# Patient Record
Sex: Female | Born: 1985 | Race: White | Hispanic: No | Marital: Married | Smoking: Never smoker
Health system: Southern US, Community
[De-identification: ages and names within clinical notes are randomized; demographics above are authoritative.]

## PROBLEM LIST (undated history)

## (undated) DIAGNOSIS — I10 Essential (primary) hypertension: Secondary | ICD-10-CM

## (undated) DIAGNOSIS — K219 Gastro-esophageal reflux disease without esophagitis: Secondary | ICD-10-CM

## (undated) DIAGNOSIS — F419 Anxiety disorder, unspecified: Secondary | ICD-10-CM

## (undated) DIAGNOSIS — T7840XA Allergy, unspecified, initial encounter: Secondary | ICD-10-CM

## (undated) DIAGNOSIS — F32A Depression, unspecified: Secondary | ICD-10-CM

## (undated) DIAGNOSIS — J45909 Unspecified asthma, uncomplicated: Secondary | ICD-10-CM

## (undated) DIAGNOSIS — E079 Disorder of thyroid, unspecified: Secondary | ICD-10-CM

## (undated) HISTORY — DX: Unspecified asthma, uncomplicated: J45.909

## (undated) HISTORY — DX: Gastro-esophageal reflux disease without esophagitis: K21.9

## (undated) HISTORY — DX: Anxiety disorder, unspecified: F41.9

## (undated) HISTORY — DX: Essential (primary) hypertension: I10

## (undated) HISTORY — PX: CHOLECYSTECTOMY: SHX55

## (undated) HISTORY — DX: Depression, unspecified: F32.A

## (undated) HISTORY — DX: Allergy, unspecified, initial encounter: T78.40XA

## (undated) HISTORY — PX: ESOPHAGUS SURGERY: SHX626

---

## 2008-05-27 ENCOUNTER — Ambulatory Visit: Payer: Self-pay | Admitting: Family Medicine

## 2008-05-27 DIAGNOSIS — G43909 Migraine, unspecified, not intractable, without status migrainosus: Secondary | ICD-10-CM | POA: Insufficient documentation

## 2008-06-01 ENCOUNTER — Encounter: Payer: Self-pay | Admitting: Family Medicine

## 2008-10-22 DIAGNOSIS — I10 Essential (primary) hypertension: Secondary | ICD-10-CM | POA: Insufficient documentation

## 2011-01-24 NOTE — Letter (Signed)
Summary: GYNECOLOGY NOTES  GYNECOLOGY NOTES   Imported By: Harlene Salts 07/16/2008 14:56:34  _____________________________________________________________________  External Attachment:    Type:   Image     Comment:   External Document

## 2014-09-01 DIAGNOSIS — K219 Gastro-esophageal reflux disease without esophagitis: Secondary | ICD-10-CM | POA: Insufficient documentation

## 2014-11-17 ENCOUNTER — Emergency Department
Admission: EM | Admit: 2014-11-17 | Discharge: 2014-11-17 | Disposition: A | Discharge status: Home / Self Care | Payer: PRIVATE HEALTH INSURANCE | Source: Home / Self Care

## 2014-11-17 ENCOUNTER — Encounter: Payer: Self-pay | Admitting: *Deleted

## 2014-11-17 DIAGNOSIS — J069 Acute upper respiratory infection, unspecified: Secondary | ICD-10-CM

## 2014-11-17 DIAGNOSIS — J029 Acute pharyngitis, unspecified: Secondary | ICD-10-CM

## 2014-11-17 DIAGNOSIS — B9789 Other viral agents as the cause of diseases classified elsewhere: Principal | ICD-10-CM

## 2014-11-17 LAB — POCT RAPID STREP A (OFFICE): Rapid Strep A Screen: NEGATIVE

## 2014-11-17 MED ORDER — AZITHROMYCIN 250 MG PO TABS: ORAL_TABLET | ORAL | Status: DC

## 2014-11-17 MED ORDER — PREDNISONE 20 MG PO TABS: 20.0000 mg | ORAL_TABLET | Freq: Two times a day (BID) | ORAL | Status: DC

## 2014-11-17 MED ORDER — BENZONATATE 200 MG PO CAPS: 200.0000 mg | ORAL_CAPSULE | Freq: Every day | ORAL | Status: DC

## 2014-11-17 NOTE — Discharge Instructions (Signed)
Take plain Mucinex (1200 mg guaifenesin) twice daily for cough and congestion.  May add Sudafed for sinus congestion.   Increase fluid intake, rest. May use Afrin nasal spray (or generic oxymetazoline) twice daily for about 5 days.  Also recommend using saline nasal spray several times daily and saline nasal irrigation (AYR is a common brand) Try warm salt water gargles for sore throat.  Stop all antihistamines for now, and other non-prescription cough/cold preparations. Continue albuterol inhaler as needed. Begin Azithromycin if not improving about one week or if persistent fever develops Follow-up with family doctor if not improving 7 to 10 days.

## 2014-11-17 NOTE — ED Provider Notes (Signed)
CSN: [id number]     Arrival date & time 11/17/14  1359 History   None    Chief Complaint  Patient presents with  . Sore Throat  . Cough  . Otalgia     HPI Comments: Patient developed a sore throat and fatigue yesterday.  Last night she developed a partly productive cough.  Today she developed myalgias, sinus congestion, chills/sweats, and left ear feels full. She has had occasional wheezing. She does not have asthma but has had bronchospasm in the past with respiratory infections and has been prescribed an albuterol inhaler.  She does have a family history of asthma (maternal uncle and grandfather).  The history is provided by the patient.    History reviewed. No pertinent past medical history. Past Surgical History  Procedure Laterality Date  . Esophagus surgery     Family History  Problem Relation Age of Onset  . Hypertension Mother   . Hypertension Father    History  Substance Use Topics  . Smoking status: Never Smoker   . Smokeless tobacco: Not on file  . Alcohol Use: No   OB History    No data available     Review of Systems + sore throat + cough No pleuritic pain + wheezing + nasal congestion + post-nasal drainage No sinus pain/pressure No itchy/red eyes ? earache No hemoptysis No SOB No fever, + chills/sweats + nausea No vomiting No abdominal pain No diarrhea No urinary symptoms No skin rash + fatigue + myalgias + headache Used OTC meds without relief   Allergies  Latex  Home Medications   Prior to Admission medications   Medication Sig Start Date End Date Taking? Authorizing Provider  cetirizine (ZYRTEC) 10 MG tablet Take 10 mg by mouth daily.   Yes Historical Provider, MD  dicyclomine (BENTYL) 10 MG capsule Take 10 mg by mouth 4 (four) times daily -  before meals and at bedtime.   Yes Historical Provider, MD  MULTIPLE VITAMIN PO Take by mouth.   Yes Historical Provider, MD  omeprazole (PRILOSEC) 40 MG capsule Take 40 mg by mouth daily.    Yes Historical Provider, MD  azithromycin (ZITHROMAX Z-PAK) 250 MG tablet Take 2 tabs today; then begin one tab once daily for 4 more days. (Rx void after 11/25/14) 11/17/14   Lattie HawStephen A Beese, MD  benzonatate (TESSALON) 200 MG capsule Take 1 capsule (200 mg total) by mouth at bedtime. 11/17/14   Lattie HawStephen A Beese, MD  predniSONE (DELTASONE) 20 MG tablet Take 1 tablet (20 mg total) by mouth 2 (two) times daily. Take with food. 11/17/14   Lattie HawStephen A Beese, MD   BP 131/83 mmHg  Pulse 102  Temp(Src) 98.4 F (36.9 C) (Oral)  Resp 18  Ht 5\' 5"  (1.651 m)  Wt 220 lb (99.791 kg)  BMI 36.61 kg/m2  SpO2 99%  LMP 11/17/2014 Physical Exam Nursing notes and Vital Signs reviewed. Appearance:  Patient appears stated age, and in no acute distress.  Patient is obese (BMI 36.6) Eyes:  Pupils are equal, round, and reactive to light and accomodation.  Extraocular movement is intact.  Conjunctivae are not inflamed  Ears:  Canals normal.  Tympanic membranes normal.  Nose:  Mildly congested turbinates.  No sinus tenderness.  Pharynx:  Normal Neck:  Supple.   Tender enlarged posterior nodes are palpated bilaterally  Lungs:  Clear to auscultation.  Breath sounds are equal.  Chest:  Distinct tenderness to palpation over the mid-sternum.  Heart:  Regular rate and rhythm  without murmurs, rubs, or gallops.  Abdomen:  Nontender without masses or hepatosplenomegaly.  Bowel sounds are present.  No CVA or flank tenderness.  Extremities:  No edema.  No calf tenderness Skin:  No rash present.   ED Course  Procedures  None    Labs Reviewed  POCT RAPID STREP A (OFFICE) negative         MDM   1. Viral URI with cough; suspect intermittent bronchospasm    There is no evidence of bacterial infection today.  Treat symptomatically for now  Begin prednisone burst.  Prescription written for Benzonatate (Tessalon) to take at bedtime for night-time cough.  Take plain Mucinex (1200 mg guaifenesin) twice daily for cough and  congestion.  May add Sudafed for sinus congestion.   Increase fluid intake, rest. May use Afrin nasal spray (or generic oxymetazoline) twice daily for about 5 days.  Also recommend using saline nasal spray several times daily and saline nasal irrigation (AYR is a common brand) Try warm salt water gargles for sore throat.  Stop all antihistamines for now, and other non-prescription cough/cold preparations. Continue albuterol inhaler as needed. Begin Azithromycin if not improving about one week or if persistent fever develops (Given a prescription to hold, with an expiration date)  Follow-up with family doctor if not improving 7 to 10 days.     Lattie HawStephen A Beese, MD 11/17/14 [phone]

## 2014-11-17 NOTE — ED Notes (Signed)
Pt c/o sore throat, cough, sneezing, LT ear ache and congestion x 1 day. Denies fever.

## 2016-06-15 DIAGNOSIS — E038 Other specified hypothyroidism: Secondary | ICD-10-CM | POA: Insufficient documentation

## 2018-04-04 DIAGNOSIS — E063 Autoimmune thyroiditis: Secondary | ICD-10-CM | POA: Insufficient documentation

## 2019-02-04 DIAGNOSIS — M519 Unspecified thoracic, thoracolumbar and lumbosacral intervertebral disc disorder: Secondary | ICD-10-CM | POA: Insufficient documentation

## 2021-01-06 DIAGNOSIS — J309 Allergic rhinitis, unspecified: Secondary | ICD-10-CM | POA: Insufficient documentation

## 2021-03-18 DIAGNOSIS — F4310 Post-traumatic stress disorder, unspecified: Secondary | ICD-10-CM | POA: Insufficient documentation

## 2022-01-31 ENCOUNTER — Telehealth: Payer: PRIVATE HEALTH INSURANCE | Admitting: Physician Assistant

## 2022-01-31 DIAGNOSIS — K529 Noninfective gastroenteritis and colitis, unspecified: Secondary | ICD-10-CM | POA: Diagnosis not present

## 2022-01-31 NOTE — Progress Notes (Signed)
Virtual Visit Consent   Heather Krause, you are scheduled for a virtual visit with a [facility] provider today.     Just as with appointments in the office, your consent must be obtained to participate.  Your consent will be active for this visit and any virtual visit you may have with one of our providers in the next 365 days.     If you have a MyChart account, a copy of this consent can be sent to you electronically.  All virtual visits are billed to your insurance company just like a traditional visit in the office.    As this is a virtual visit, video technology does not allow for your provider to perform a traditional examination.  This may limit your provider's ability to fully assess your condition.  If your provider identifies any concerns that need to be evaluated in person or the need to arrange testing (such as labs, EKG, etc.), we will make arrangements to do so.     Although advances in technology are sophisticated, we cannot ensure that it will always work on either your end or our end.  If the connection with a video visit is poor, the visit may have to be switched to a telephone visit.  With either a video or telephone visit, we are not always able to ensure that we have a secure connection.     I need to obtain your verbal consent now.   Are you willing to proceed with your visit today?    Heather Krause has provided verbal consent on 01/31/2022 for a virtual visit (video or telephone).   Piedad Climes, [place]   Date: 01/31/2022 8:33 AM   Virtual Visit via Video Note   I, Piedad Climes, connected with  Heather Krause  ([id number], 1986-10-07) on 01/31/22 at  8:15 AM EST by a video-enabled telemedicine application and verified that I am speaking with the correct person using two identifiers.  Location: Patient: Virtual Visit Location Patient: Home Provider: Virtual Visit Location Provider: Home Office   I discussed the limitations of evaluation and management  by telemedicine and the availability of in person appointments. The patient expressed understanding and agreed to proceed.    History of Present Illness: Heather Krause is a 36 y.o. who identifies as a female who was assigned female at birth, and is being seen today for nausea and vomiting last night. Notes eating a sub for lunch and right after having some mild stomach upset. Went to the bathroom and then felt fine. Around 9 o'clock last night noted abdominal cramping, nausea and vomiting about every hour since onset. Denies hematemesis. Is also noting diarrhea that was very frequently but has slowed down at this point. Denies fever. Last episode of emesis was about 7 AM.   HPI: HPI  Problems:  Patient Active Problem List   Diagnosis Date Noted   MIGRAINE HEADACHE 05/27/2008    Allergies:  Allergies  Allergen Reactions   Bisoprolol-Hydrochlorothiazide Swelling   Diclofenac Sodium Other (See Comments)   Latex    Medications:  Current Outpatient Medications:    cetirizine (ZYRTEC) 10 MG tablet, Take 10 mg by mouth daily., Disp: , Rfl:    dicyclomine (BENTYL) 10 MG capsule, Take 10 mg by mouth 4 (four) times daily -  before meals and at bedtime., Disp: , Rfl:    MULTIPLE VITAMIN PO, Take by mouth., Disp: , Rfl:    omeprazole (PRILOSEC) 40 MG capsule, Take 40  mg by mouth daily., Disp: , Rfl:   Observations/Objective: Patient is well-developed, well-nourished in no acute distress.  Resting comfortably at home.  Head is normocephalic, atraumatic.  No labored breathing. Speech is clear and coherent with logical content.  Patient is alert and oriented at baseline.   Assessment and Plan: 1. Gastroenteritis  Viral versus likely mild food poisoning. No melena, hematochezia or hematemesis. Stools have slowed down. Vomiting improved after she used an old phenergan suppository she had from history of migraines. No indication for antibiotic at present. Will have her push fluids for hydration.  Will give Rx for Zofran ODT to help with nausea and prevent further vomiting so she can successfully hydrate. Will work towards bland foods when ready. BRAT diet handout given. Will have her increase her PPI to BID dosing for the next 2-3 days giving gastritis from retching. Very strict ER precautions reviewed with patient.   Follow Up Instructions: I discussed the assessment and treatment plan with the patient. The patient was provided an opportunity to ask questions and all were answered. The patient agreed with the plan and demonstrated an understanding of the instructions.  A copy of instructions were sent to the patient via MyChart unless otherwise noted below.   The patient was advised to call back or seek an in-person evaluation if the symptoms worsen or if the condition fails to improve as anticipated.  Time:  I spent 12 minutes with the patient via telehealth technology discussing the above problems/concerns.    Piedad Climes, PA-C

## 2022-01-31 NOTE — Patient Instructions (Signed)
Heather Krause, thank you for joining Piedad Climes, PA-C for today's virtual visit.  While this provider is not your primary care provider (PCP), if your PCP is located in our provider database this encounter information will be shared with them immediately following your visit.  Consent: [name] provided verbal consent for this virtual visit at the beginning of the encounter.  Current Medications:  Current Outpatient Medications:    azithromycin (ZITHROMAX Z-PAK) 250 MG tablet, Take 2 tabs today; then begin one tab once daily for 4 more days. (Rx void after 11/25/14), Disp: 6 tablet, Rfl: 0   benzonatate (TESSALON) 200 MG capsule, Take 1 capsule (200 mg total) by mouth at bedtime., Disp: 12 capsule, Rfl: 0   cetirizine (ZYRTEC) 10 MG tablet, Take 10 mg by mouth daily., Disp: , Rfl:    dicyclomine (BENTYL) 10 MG capsule, Take 10 mg by mouth 4 (four) times daily -  before meals and at bedtime., Disp: , Rfl:    MULTIPLE VITAMIN PO, Take by mouth., Disp: , Rfl:    omeprazole (PRILOSEC) 40 MG capsule, Take 40 mg by mouth daily., Disp: , Rfl:    predniSONE (DELTASONE) 20 MG tablet, Take 1 tablet (20 mg total) by mouth 2 (two) times daily. Take with food., Disp: 10 tablet, Rfl: 0   Medications ordered in this encounter:  No orders of the defined types were placed in this encounter.    *If you need refills on other medications prior to your next appointment, please contact your pharmacy*  Follow-Up: Call back or seek an in-person evaluation if the symptoms worsen or if the condition fails to improve as anticipated.  Other Instructions Please take the zofran as directed to keep nausea down and prevent vomiting.  Continue to try and hydrate, starting with water and then when tolerating well, adding on a low sugar electrolyte water like pedialyte. Once you feel up to it, you can start solid foods following diet below.   Increase your omeprazole to twice daily dosing  for the next couple of days.   If you note any recurrence of significant vomiting despite medication, and/or severe diarrhea -- you need evaluation at the ER. DO NOT DELAY CARE!  Bland Diet A bland diet consists of foods that are often soft and do not have a lot of fat, fiber, or extra seasonings. Foods without fat, fiber, or seasoning are easier for the body to digest. They are also less likely to irritate your mouth, throat, stomach, and other parts of your digestive system. A bland diet is sometimes called a BRAT diet. What is my plan? Your health care provider or food and nutrition specialist (dietitian) may recommend specific changes to your diet to prevent symptoms or to treat your symptoms. These changes may include: Eating small meals often. Cooking food until it is soft enough to chew easily. Chewing your food well. Drinking fluids slowly. Not eating foods that are very spicy, sour, or fatty. Not eating citrus fruits, such as oranges and grapefruit. What do I need to know about this diet? Eat a variety of foods from the bland diet food list. Do not follow a bland diet longer than needed. Ask your health care provider whether you should take vitamins or supplements. What foods can I eat? Grains Hot cereals, such as cream of wheat. Rice. Bread, crackers, or tortillas made from refined white flour. Vegetables Canned or cooked vegetables. Mashed or boiled potatoes. Fruits Bananas. Applesauce. Other types of cooked  or canned fruit with the skin and seeds removed, such as canned peaches or pears. Meats and other proteins Scrambled eggs. Creamy peanut butter or other nut butters. Lean, well-cooked meats, such as chicken or fish. Tofu. Soups or broths. Dairy Low-fat dairy products, such as milk, cottage cheese, or yogurt. Beverages Water. Herbal tea. Apple juice. Fats and oils Mild salad dressings. Canola or olive oil. Sweets and desserts Pudding. Custard. Fruit gelatin. Ice  cream. The items listed above may not be a complete list of recommended foods and beverages. Contact a dietitian for more options. What foods are not recommended? Grains Whole grain breads and cereals. Vegetables Raw vegetables. Fruits Raw fruits, especially citrus, berries, or dried fruits. Dairy Whole fat dairy foods. Beverages Caffeinated drinks. Alcohol. Seasonings and condiments Strongly flavored seasonings or condiments. Hot sauce. Salsa. Other foods Spicy foods. Fried foods. Sour foods, such as pickled or fermented foods. Foods with high sugar content. Foods high in fiber. The items listed above may not be a complete list of foods and beverages to avoid. Contact a dietitian for more information. Summary A bland diet consists of foods that are often soft and do not have a lot of fat, fiber, or extra seasonings. Foods without fat, fiber, or seasoning are easier for the body to digest. Check with your health care provider to see how long you should follow this diet plan. It is not meant to be followed for long periods. This information is not intended to replace advice given to you by your health care provider. Make sure you discuss any questions you have with your health care provider. Document Revised: 01/09/2018 Document Reviewed: 01/09/2018 Elsevier Patient Education  2022 [employer].    If you have been instructed to have an in-person evaluation today at a local Urgent Care facility, please use the link below. It will take you to a list of all of our available [facility], including address, phone number and hours of operation. Please do not delay care.  [facility]  If you or a family member do not have a primary care provider, use the link below to schedule a visit and establish care. When you choose a [place] primary care physician or advanced practice provider, you gain a long-term partner in health. Find a Primary Care Provider  Learn  more about [facility]'s in-office and virtual care options: [facility] - Get Care Now

## 2022-02-01 ENCOUNTER — Encounter: Payer: Self-pay | Admitting: Family Medicine

## 2022-02-09 NOTE — Telephone Encounter (Signed)
She was seen by Malva Cogan, PA-C.  She is not my patient.

## 2022-03-22 ENCOUNTER — Emergency Department (INDEPENDENT_AMBULATORY_CARE_PROVIDER_SITE_OTHER): Payer: No Typology Code available for payment source

## 2022-03-22 ENCOUNTER — Emergency Department
Admission: RE | Admit: 2022-03-22 | Discharge: 2022-03-22 | Disposition: A | Discharge status: Home / Self Care | Payer: No Typology Code available for payment source | Source: Ambulatory Visit

## 2022-03-22 VITALS — BP 164/105 | HR 76 | Temp 98.3°F | Resp 18

## 2022-03-22 DIAGNOSIS — S6992XA Unspecified injury of left wrist, hand and finger(s), initial encounter: Secondary | ICD-10-CM | POA: Diagnosis not present

## 2022-03-22 DIAGNOSIS — M79642 Pain in left hand: Secondary | ICD-10-CM

## 2022-03-22 DIAGNOSIS — R030 Elevated blood-pressure reading, without diagnosis of hypertension: Secondary | ICD-10-CM

## 2022-03-22 HISTORY — DX: Disorder of thyroid, unspecified: E07.9

## 2022-03-22 MED ORDER — PREDNISONE 20 MG PO TABS: ORAL_TABLET | ORAL | 0 refills | Status: DC

## 2022-03-22 NOTE — Discharge Instructions (Addendum)
Advised patient of left hand/left wrist x-ray results with hard copy provided with AVS this evening.  Advised patient to take medication as directed with food to completion.  Instructed patient to start this medication tomorrow morning, Thursday, 03/23/2022.  Advised patient if symptoms worsen and/or unresolved please follow-up with [facility] orthopedic provider (contact information has been provided above with this AVS).  Encouraged patient to increase daily water intake while taking this medication.  Advised patient to monitor blood pressure in the morning prior to eating for the next 7 to 10 days and to log measurements so that PCP can evaluate current daily blood pressure trends. ?

## 2022-03-22 NOTE — ED Provider Notes (Signed)
?[facility] ? ? ? ?CSN: [id number] ?Arrival date & time: 03/22/22  1753 ? ? ?  ? ?History   ?Chief Complaint ?Chief Complaint  ?Patient presents with  ? Hand Problem  ?  LT  ? ? ?HPI ?Heather Krause is a 36 y.o. female.  ? ?HPI 36 year old female presents with right hand pain since Friday evening, 03/17/2022.  Reports she was pulled by her dog's slamming her hand into brick wall.  Patient rates current right hand pain is 7 of 10. ? ?Past Medical History:  ?Diagnosis Date  ? Thyroid disease   ? ? ?Patient Active Problem List  ? Diagnosis Date Noted  ? MIGRAINE HEADACHE 05/27/2008  ? ? ?Past Surgical History:  ?Procedure Laterality Date  ? CHOLECYSTECTOMY    ? ESOPHAGUS SURGERY    ? ? ?OB History   ?No obstetric history on file. ?  ? ? ? ?Home Medications   ? ?Prior to Admission medications   ?Medication Sig Start Date End Date Taking? Authorizing Provider  ?gabapentin (NEURONTIN) 100 MG capsule Take by mouth. 07/01/21  Yes [provider]  ?Levothyroxine Sodium (TIROSINT) 200 MCG CAPS Take by mouth. 12/01/21  Yes [provider]  ?norethindrone (AYGESTIN) 5 MG tablet Take 1 tablet by mouth daily. 08/15/21  Yes [provider]  ?pantoprazole (PROTONIX) 40 MG tablet Take 1 tablet by mouth daily. 11/19/19 03/15/23 Yes [provider]  ?predniSONE (DELTASONE) 20 MG tablet Take 3 tabs PO daily x 5 days. 03/22/22  Yes Trevor Iha, FNP  ?sertraline (ZOLOFT) 50 MG tablet Take 1 tablet by mouth daily. 09/12/21  Yes [provider]  ?topiramate (TOPAMAX) 50 MG tablet Take 1 tablet by mouth daily. 03/16/22  Yes [provider]  ?AIMOVIG 140 MG/ML SOAJ Inject 1 mL into the skin every 30 (thirty) days. 03/21/22   [provider]  ?ARMOUR THYROID 30 MG tablet Take 30 mg by mouth daily. 03/21/22   [provider]  ?cetirizine (ZYRTEC) 10 MG tablet Take 10 mg by mouth daily.    [provider]  ?dicyclomine (BENTYL) 10 MG capsule Take 10 mg by  mouth 4 (four) times daily -  before meals and at bedtime.    [provider]  ?MULTIPLE VITAMIN PO Take by mouth.    [provider]  ?omeprazole (PRILOSEC) 40 MG capsule Take 40 mg by mouth daily.    [provider]  ?UBRELVY 100 MG TABS Take by mouth. 03/16/22   [provider]  ? ? ?Family History ?Family History  ?Problem Relation Age of Onset  ? Hypertension Mother   ? Aneurysm Mother   ? Hypertension Father   ? Cancer Maternal Grandmother   ? Diabetes Maternal Grandmother   ? ? ?Social History ?Social History  ? ?Tobacco Use  ? Smoking status: Never  ?Vaping Use  ? Vaping Use: Never used  ?Substance Use Topics  ? Alcohol use: No  ? Drug use: No  ? ? ? ?Allergies   ?Bisoprolol-hydrochlorothiazide, Diclofenac sodium, and Latex ? ? ?Review of Systems ?Review of Systems ? ? ?Physical Exam ?Triage Vital Signs ?ED Triage Vitals  ?Enc Vitals Group  ?   BP   ?   Pulse   ?   Resp   ?   Temp   ?   Temp src   ?   SpO2   ?   Weight   ?   Height   ?   Head Circumference   ?  Peak Flow   ?   Pain Score   ?   Pain Loc   ?   Pain Edu?   ?   Excl. in GC?   ? ?No data found. ? ?Updated Vital Signs ?BP (!) 164/105 (BP Location: Right Arm)   Pulse 76   Temp 98.3 ?F (36.8 ?C) (Oral)   Resp 18   LMP  (LMP Unknown)   SpO2 100%  ? ?Physical Exam ?Vitals and nursing note reviewed.  ?Constitutional:   ?   General: She is not in acute distress. ?   Appearance: Normal appearance. She is obese. She is not ill-appearing.  ?HENT:  ?   Head: Normocephalic and atraumatic.  ?   Mouth/Throat:  ?   Mouth: Mucous membranes are moist.  ?   Pharynx: Oropharynx is clear.  ?Eyes:  ?   Extraocular Movements: Extraocular movements intact.  ?   Conjunctiva/sclera: Conjunctivae normal.  ?   Pupils: Pupils are equal, round, and reactive to light.  ?Cardiovascular:  ?   Rate and Rhythm: Normal rate and regular rhythm.  ?   Pulses: Normal pulses.  ?   Heart sounds: Normal heart sounds.  ?Pulmonary:  ?   Effort:  Pulmonary effort is normal.  ?   Breath sounds: Normal breath sounds. No wheezing, rhonchi or rales.  ?Musculoskeletal:  ?   Cervical back: Normal range of motion and neck supple. No tenderness.  ?   Comments: Left hand (dorsum): TTP over proximal 3rd, 4th, 5th metacarpals, capitate, and hamate; with soft tissue swelling noted, grip is 2/5, unable to make fist, neurovascular intact, neurosensory intact.  ?Lymphadenopathy:  ?   Cervical: No cervical adenopathy.  ?Skin: ?   General: Skin is warm and dry.  ?Neurological:  ?   General: No focal deficit present.  ?   Mental Status: She is alert and oriented to person, place, and time. Mental status is at baseline.  ? ? ? ?UC Treatments / Results  ?Labs ?(all labs ordered are listed, but only abnormal results are displayed) ?Labs Reviewed - No data to display ? ?EKG ? ? ?Radiology ?DG Wrist Complete Left ? ?Result Date: 03/22/2022 ?CLINICAL DATA:  Injury 5 days ago, pain of third through fifth digits and medial hand and wrist pain EXAM: LEFT WRIST - COMPLETE 3+ VIEW COMPARISON:  None. FINDINGS: There is no evidence of fracture or dislocation. There is no evidence of arthropathy or other focal bone abnormality. Soft tissues are unremarkable. IMPRESSION: No fracture or dislocation of the left wrist. The carpus is normally aligned. No radiographic findings to explain pain. Electronically Signed   By: Jearld LeschAlex D Bibbey M.D.   On: 03/22/2022 18:41  ? ?DG Hand Complete Left ? ?Result Date: 03/22/2022 ?CLINICAL DATA:  Injured hand 5 days ago. EXAM: LEFT HAND - COMPLETE 3+ VIEW COMPARISON:  None. FINDINGS: The joint spaces are maintained.  No acute fracture is identified. IMPRESSION: No acute bony findings. Electronically Signed   By: Rudie MeyerP.  Gallerani M.D.   On: 03/22/2022 18:41   ? ?Procedures ?Procedures (including critical care time) ? ?Medications Ordered in UC ?Medications - No data to display ? ?Initial Impression / Assessment and Plan / UC Course  ?I have reviewed the triage vital  signs and the nursing notes. ? ?Pertinent labs & imaging results that were available during my care of the patient were reviewed by me and considered in my medical decision making (see chart for details). ? ?  ? ?MDM: 1.  Left hand pain-advised patient of left hand/left wrist x-ray results with hard copy provided with AVS this evening.  Rx'd prednisone for left hand swelling.  Advised patient to take medication as directed with food to completion.  Instructed patient to start this medication tomorrow morning, Thursday, 03/23/2022.  Advised patient if symptoms worsen and/or unresolved please follow-up with [facility] orthopedic provider (contact information has been provided above with this AVS).  Encouraged patient to increase daily water intake while taking this medication.  2.  Elevated BP without diagnosis of hypertension-Advised patient to monitor blood pressure in the morning prior to eating for the next 7 to 10 days and to log measurements so that PCP can evaluate current daily blood pressure trends.  Patient discharged home, hemodynamically stable. ?Final Clinical Impressions(s) / UC Diagnoses  ? ?Final diagnoses:  ?Left hand pain  ?Elevated BP without diagnosis of hypertension  ? ? ? ?Discharge Instructions   ? ?  ?Advised patient of left hand/left wrist x-ray results with hard copy provided with AVS this evening.  Advised patient to take medication as directed with food to completion.  Instructed patient to start this medication tomorrow morning, Thursday, 03/23/2022.  Advised patient if symptoms worsen and/or unresolved please follow-up with [facility] orthopedic provider (contact information has been provided above with this AVS).  Encouraged patient to increase daily water intake while taking this medication.  Advised patient to monitor blood pressure in the morning prior to eating for the next 7 to 10 days and to log measurements so that PCP can evaluate current daily blood pressure trends. ? ? ? ? ?ED  Prescriptions   ? ? Medication Sig Dispense Auth. Provider  ? predniSONE (DELTASONE) 20 MG tablet Take 3 tabs PO daily x 5 days. 15 tablet Trevor Iha, FNP  ? ?  ? ?PDMP not reviewed this encounter. ?  ?Lamees Gable,

## 2022-03-22 NOTE — ED Triage Notes (Addendum)
Pt c/o LT hand pain since Friday evening when she was pulled by her dogs, slamming her hand into a brick wall. Swelling and bruising noted. Pain 7/10 ?

## 2022-04-12 ENCOUNTER — Ambulatory Visit: Payer: PRIVATE HEALTH INSURANCE | Admitting: Family Medicine

## 2022-04-24 ENCOUNTER — Encounter: Payer: Self-pay | Admitting: Family Medicine

## 2022-04-24 ENCOUNTER — Ambulatory Visit (INDEPENDENT_AMBULATORY_CARE_PROVIDER_SITE_OTHER): Payer: No Typology Code available for payment source | Admitting: Family Medicine

## 2022-04-24 VITALS — BP 136/89 | HR 65 | Ht 65.0 in | Wt 221.0 lb

## 2022-04-24 DIAGNOSIS — R590 Localized enlarged lymph nodes: Secondary | ICD-10-CM

## 2022-04-24 DIAGNOSIS — G43709 Chronic migraine without aura, not intractable, without status migrainosus: Secondary | ICD-10-CM

## 2022-04-24 DIAGNOSIS — I10 Essential (primary) hypertension: Secondary | ICD-10-CM

## 2022-04-24 DIAGNOSIS — E038 Other specified hypothyroidism: Secondary | ICD-10-CM | POA: Diagnosis not present

## 2022-04-24 DIAGNOSIS — E063 Autoimmune thyroiditis: Secondary | ICD-10-CM

## 2022-04-24 DIAGNOSIS — F4310 Post-traumatic stress disorder, unspecified: Secondary | ICD-10-CM

## 2022-04-24 DIAGNOSIS — R600 Localized edema: Secondary | ICD-10-CM | POA: Insufficient documentation

## 2022-04-24 NOTE — Patient Instructions (Signed)
Very nice to meet you today! ?We'll be in touch with lab results.  ?You can stop downstairs to schedule ultrasound or they will contact you for appt.  ?

## 2022-04-24 NOTE — Assessment & Plan Note (Signed)
History of migraines, managed by neurology at this time.  Fairly stable with Aimovig and Ubrelvy as needed.  She has received Botox at times. ?

## 2022-04-24 NOTE — Assessment & Plan Note (Signed)
History of anxiety related to PTSD.  Currently on sertraline, she will continue this at current strength. ?

## 2022-04-24 NOTE — Assessment & Plan Note (Signed)
Managed by endocrinology.  She is using combination of levothyroxine and Armour Thyroid.  Updating labs today ?

## 2022-04-24 NOTE — Assessment & Plan Note (Signed)
This appears to be dependent in nature, likely related to her prolonged standing as well as stationary in a vehicle coming back from [place].  There is no pain associated with this at this time.  I am less concerned about DVT at this time. ?

## 2022-04-24 NOTE — Progress Notes (Signed)
?Heather Krause - 36 y.o. female MRN [id number]  Date of birth: 03/01/1986 ? ?Subjective ?Chief Complaint  ?Patient presents with  ? Establish Care  ? Leg Swelling  ? Hypertension  ? ? ?HPI ?Heather Krause is a 36 year old female here today for initial visit to establish care.  She has a history of Hashimoto's thyroiditis with hypothyroidism, migraines, and PTSD with anxiety. ? ?She has been seeing endocrinology at [facility] for management of her hypothyroidism.  Currently managed with levothyroxine at 200 mcg and Armour Thyroid at 30 mg.  Feels pretty good at current dosing.  She is due to have updated labs.  She would prefer to have labs completed here and faxed over to her endocrinologist at [facility].  Her endocrinologist has also started her on Wegovy.  She was off of this for a period of time but has just recently restarted. ? ?History of migraines is currently managed through neurology at [facility].  Currently taking Aimovig daily with Bernita Raisin as needed.  She has also received Botox for management of her migraines. ? ?She has also noticed some swelling in her lower extremities.  This is most pronounced recently after working event where she was standing for prolonged period of time in the heat.  She also had a 8 to 9-hour car trip back afterwards.  She has been elevating and noted some improvement since returning.  She denies any pain associated with the swelling, shortness of breath. ? ?ROS:  A comprehensive ROS was completed and negative except as noted per HPI ? ? ? ?Allergies  ?Allergen Reactions  ? Bee Venom Anaphylaxis  ? Bisoprolol-Hydrochlorothiazide Swelling  ? Diclofenac Sodium Other (See Comments)  ? Fluarix Quadrivalent [Influenza Vac Split Quad] Other (See Comments)  ?  Autoimmune response  ? Latex Rash  ? ? ?Past Medical History:  ?Diagnosis Date  ? Allergy   ? Latex  ? Anxiety   ? Asthma   ? Depression   ? GERD (gastroesophageal reflux disease)   ? Hypertension   ? Thyroid disease   ? ? ?Past  Surgical History:  ?Procedure Laterality Date  ? CHOLECYSTECTOMY    ? ESOPHAGUS SURGERY    ? ? ?Social History  ? ?Socioeconomic History  ? Marital status: Married  ?  Spouse name: Not on file  ? Number of children: Not on file  ? Years of education: Not on file  ? Highest education level: Not on file  ?Occupational History  ? Not on file  ?Tobacco Use  ? Smoking status: Never  ?  Passive exposure: Never  ? Smokeless tobacco: Never  ?Vaping Use  ? Vaping Use: Never used  ?Substance and Sexual Activity  ? Alcohol use: Yes  ?  Comment: Occasionally  ? Drug use: No  ? Sexual activity: Yes  ?  Partners: Male  ?  Birth control/protection: None  ?Other Topics Concern  ? Not on file  ?Social History Narrative  ? Not on file  ? ?Social Determinants of Health  ? ?Financial Resource Strain: Not on file  ?Food Insecurity: Not on file  ?Transportation Needs: Not on file  ?Physical Activity: Not on file  ?Stress: Not on file  ?Social Connections: Not on file  ? ? ?Family History  ?Problem Relation Age of Onset  ? Hypertension Mother   ? Aneurysm Mother   ? Anxiety disorder Mother   ? Arthritis Mother   ? Depression Mother   ? Hypertension Father   ? Arthritis Father   ?  Cancer Maternal Grandmother   ? Diabetes Maternal Grandmother   ? Kidney disease Maternal Grandmother   ? Vision loss Maternal Grandmother   ? Varicose Veins Maternal Grandmother   ? Asthma Maternal Grandfather   ? Heart disease Paternal Grandfather   ? ? ?Health Maintenance  ?Topic Date Due  ? HIV Screening  Never done  ? Hepatitis C Screening  Never done  ? TETANUS/TDAP  Never done  ? PAP SMEAR-Modifier  Never done  ? COVID-19 Vaccine (3 - Booster for Janssen series) 02/05/2021  ? INFLUENZA VACCINE  07/25/2022  ? HPV VACCINES  Aged Out  ? ? ? ?----------------------------------------------------------------------------------------------------------------------------------------------------------------------------------------------------------------- ?Physical  Exam ?BP 136/89 (BP Location: Left Arm, Patient Position: Sitting, Cuff Size: Large)   Pulse 65   Ht 5\' 5"  (1.651 m)   Wt 221 lb (100.2 kg)   SpO2 100%   BMI 36.78 kg/m?  ? ?Physical Exam ?Constitutional:   ?   Appearance: Normal appearance.  ?Eyes:  ?   General: No scleral icterus. ?Cardiovascular:  ?   Rate and Rhythm: Normal rate and regular rhythm.  ?Musculoskeletal:  ?   Cervical back: Neck supple.  ?Neurological:  ?   General: No focal deficit present.  ?   Mental Status: She is alert.  ?Psychiatric:     ?   Mood and Affect: Mood normal.     ?   Behavior: Behavior normal.  ? ? ?------------------------------------------------------------------------------------------------------------------------------------------------------------------------------------------------------------------- ?Assessment and Plan ? ?Hypertension ?Blood pressure looks pretty good today.  She will continue to monitor at home.  Low-sodium diet encouraged. ? ?Migraines ?History of migraines, managed by neurology at this time.  Fairly stable with Aimovig and Ubrelvy as needed.  She has received Botox at times. ? ?Hypothyroidism due to Hashimoto's thyroiditis ?Managed by endocrinology.  She is using combination of levothyroxine and Armour Thyroid.  Updating labs today ? ?PTSD (post-traumatic stress disorder) ?History of anxiety related to PTSD.  Currently on sertraline, she will continue this at current strength. ? ?Lower extremity edema ?This appears to be dependent in nature, likely related to her prolonged standing as well as stationary in a vehicle coming back from .  There is no pain associated with this at this time.  I am less concerned about DVT at this time. ? ? ?No orders of the defined types were placed in this encounter. ? ? ?Return in about 6 months (around 10/25/2022) for Thyroid/BP. ? ? ? ?This visit occurred during the SARS-CoV-2 public health emergency.  Safety protocols were in place, including screening  questions prior to the visit, additional usage of staff PPE, and extensive cleaning of exam room while observing appropriate contact time as indicated for disinfecting solutions.  ? ?

## 2022-04-24 NOTE — Assessment & Plan Note (Signed)
Blood pressure looks pretty good today.  She will continue to monitor at home.  Low-sodium diet encouraged. ?

## 2022-04-26 LAB — COMPLETE METABOLIC PANEL WITH GFR
AG Ratio: 1.6 (calc) (ref 1.0–2.5)
ALT: 18 U/L (ref 6–29)
AST: 13 U/L (ref 10–30)
Albumin: 4.1 g/dL (ref 3.6–5.1)
Alkaline phosphatase (APISO): 46 U/L (ref 31–125)
BUN: 9 mg/dL (ref 7–25)
CO2: 26 mmol/L (ref 20–32)
Calcium: 8.9 mg/dL (ref 8.6–10.2)
Chloride: 106 mmol/L (ref 98–110)
Creat: 0.73 mg/dL (ref 0.50–0.97)
Globulin: 2.6 g/dL (calc) (ref 1.9–3.7)
Glucose, Bld: 101 mg/dL — ABNORMAL HIGH (ref 65–99)
Potassium: 4.2 mmol/L (ref 3.5–5.3)
Sodium: 140 mmol/L (ref 135–146)
Total Bilirubin: 0.4 mg/dL (ref 0.2–1.2)
Total Protein: 6.7 g/dL (ref 6.1–8.1)
eGFR: 111 mL/min/{1.73_m2} (ref >=60)

## 2022-04-26 LAB — CBC WITH DIFFERENTIAL/PLATELET
Absolute Monocytes: 535 cells/uL (ref 200–950)
Basophils Absolute: 53 cells/uL (ref 0–200)
Basophils Relative: 0.8 %
Eosinophils Absolute: 79 cells/uL (ref 15–500)
Eosinophils Relative: 1.2 %
HCT: 35.5 % (ref 35.0–45.0)
Hemoglobin: 11.7 g/dL (ref 11.7–15.5)
Lymphs Abs: 1610 cells/uL (ref 850–3900)
MCH: 29 pg (ref 27.0–33.0)
MCHC: 33 g/dL (ref 32.0–36.0)
MCV: 88.1 fL (ref 80.0–100.0)
MPV: 9.4 fL (ref 7.5–12.5)
Monocytes Relative: 8.1 %
Neutro Abs: 4323 cells/uL (ref 1500–7800)
Neutrophils Relative %: 65.5 %
Platelets: 344 10*3/uL (ref 140–400)
RBC: 4.03 10*6/uL (ref 3.80–5.10)
RDW: 14.4 % (ref 11.0–15.0)
Total Lymphocyte: 24.4 %
WBC: 6.6 10*3/uL (ref 3.8–10.8)

## 2022-04-26 LAB — THYROID PEROXIDASE ANTIBODIES (TPO) (REFL): Thyroperoxidase Ab SerPl-aCnc: 427 IU/mL — ABNORMAL HIGH (ref <9)

## 2022-04-26 LAB — TSH: TSH: 37.83 mIU/L — ABNORMAL HIGH

## 2022-04-26 LAB — T4, FREE: Free T4: 0.7 ng/dL — ABNORMAL LOW (ref 0.8–1.8)

## 2022-04-26 LAB — T3, FREE: T3, Free: 2.3 pg/mL (ref 2.3–4.2)

## 2022-05-02 ENCOUNTER — Ambulatory Visit (INDEPENDENT_AMBULATORY_CARE_PROVIDER_SITE_OTHER): Payer: No Typology Code available for payment source

## 2022-05-02 DIAGNOSIS — R590 Localized enlarged lymph nodes: Secondary | ICD-10-CM

## 2022-05-03 ENCOUNTER — Encounter (INDEPENDENT_AMBULATORY_CARE_PROVIDER_SITE_OTHER): Payer: No Typology Code available for payment source | Admitting: Family Medicine

## 2022-05-03 DIAGNOSIS — A084 Viral intestinal infection, unspecified: Secondary | ICD-10-CM | POA: Diagnosis not present

## 2022-05-08 MED ORDER — ONDANSETRON 4 MG PO TBDP: 4.0000 mg | ORAL_TABLET | Freq: Three times a day (TID) | ORAL | 0 refills | Status: AC | PRN

## 2022-05-08 NOTE — Telephone Encounter (Signed)

## 2022-06-07 ENCOUNTER — Ambulatory Visit: Payer: PRIVATE HEALTH INSURANCE | Admitting: Family Medicine

## 2022-06-29 ENCOUNTER — Encounter (INDEPENDENT_AMBULATORY_CARE_PROVIDER_SITE_OTHER): Payer: Self-pay

## 2022-07-11 ENCOUNTER — Encounter: Payer: Self-pay | Admitting: Family Medicine

## 2022-07-11 MED ORDER — SEMAGLUTIDE-WEIGHT MANAGEMENT 0.25 MG/0.5ML ~~LOC~~ SOAJ: 0.2500 mg | SUBCUTANEOUS | 0 refills | Status: DC

## 2022-07-11 MED ORDER — SEMAGLUTIDE-WEIGHT MANAGEMENT 0.5 MG/0.5ML ~~LOC~~ SOAJ: 0.5000 mg | SUBCUTANEOUS | 0 refills | Status: DC

## 2022-07-19 ENCOUNTER — Telehealth: Payer: Self-pay

## 2022-07-19 NOTE — Telephone Encounter (Addendum)
Initiated Prior authorization VWP:VXYIAX 0.25MG /0.5ML auto-injectors Via: Covermymeds Case/Key: [id number] Status: denied  as of 07/19/22 Reason:This request was denied because you did not meet the following clinical requirements: The requested medication and/or diagnosis are not a covered benefit and excluded from coverage in accordance with the terms and conditions of your plan benefit. Therefore, the request has been administratively denied. Notified Pt via: Mychart

## 2022-08-07 ENCOUNTER — Encounter: Payer: Self-pay | Admitting: Family Medicine

## 2022-08-22 ENCOUNTER — Ambulatory Visit (INDEPENDENT_AMBULATORY_CARE_PROVIDER_SITE_OTHER): Payer: No Typology Code available for payment source | Admitting: Family Medicine

## 2022-08-22 ENCOUNTER — Encounter: Payer: Self-pay | Admitting: Family Medicine

## 2022-08-22 ENCOUNTER — Telehealth (HOSPITAL_BASED_OUTPATIENT_CLINIC_OR_DEPARTMENT_OTHER): Payer: Self-pay | Admitting: Family Medicine

## 2022-08-22 VITALS — BP 143/91 | HR 92 | Ht 65.0 in | Wt 227.0 lb

## 2022-08-22 DIAGNOSIS — E038 Other specified hypothyroidism: Secondary | ICD-10-CM

## 2022-08-22 DIAGNOSIS — R0602 Shortness of breath: Secondary | ICD-10-CM | POA: Diagnosis not present

## 2022-08-22 DIAGNOSIS — R071 Chest pain on breathing: Secondary | ICD-10-CM

## 2022-08-22 DIAGNOSIS — E063 Autoimmune thyroiditis: Secondary | ICD-10-CM

## 2022-08-22 DIAGNOSIS — R600 Localized edema: Secondary | ICD-10-CM

## 2022-08-22 DIAGNOSIS — R350 Frequency of micturition: Secondary | ICD-10-CM | POA: Diagnosis not present

## 2022-08-22 DIAGNOSIS — I10 Essential (primary) hypertension: Secondary | ICD-10-CM

## 2022-08-22 NOTE — Assessment & Plan Note (Signed)
Elevated TSH with low T4 levels previously.  Updated TSH and T4 levels.  Will forward to endocrinologist.

## 2022-08-22 NOTE — Progress Notes (Signed)
Heather Krause - 36 y.o. female MRN [id number]  Date of birth: [birth year]  Subjective No chief complaint on file.   HPI Heather Krause is a 36 y.o. female here today with complaint of increased swelling of her lower extremities.  Swelling does often worsen throughout the day.  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  No prior history of blood clots.  She has not had any chest pressure, fever, chills.  she has she is urinating a little more and urine is tea colored.  Think she may have gotten dehydrated at the jeep event over the weekend.  TSH elevated may.  Information sent to her endocrinologist however no adjustments to her medications were made.  She does continue on levothyroxine as well as Armour Thyroid.  She remains concerned about her thyroid.  ROS:  A comprehensive ROS was completed and negative except as noted per HPI  Allergies  Allergen Reactions   Bee Venom Anaphylaxis   Bisoprolol-Hydrochlorothiazide Swelling   Diclofenac Sodium Other (See Comments)   Fluarix Quadrivalent [Influenza Vac Split Quad] Other (See Comments)    Autoimmune response   Latex Rash    Past Medical History:  Diagnosis Date   Allergy    Latex   Anxiety    Asthma    Depression    GERD (gastroesophageal reflux disease)    Hypertension    Thyroid disease     Past Surgical History:  Procedure Laterality Date   CHOLECYSTECTOMY     ESOPHAGUS SURGERY      Social History   Socioeconomic History   Marital status: Married    Spouse name: Not on file   Number of children: Not on file   Years of education: Not on file   Highest education level: Not on file  Occupational History   Not on file  Tobacco Use   Smoking status: Never    Passive exposure: Never   Smokeless tobacco: Never  Vaping Use   Vaping Use: Never used  Substance and Sexual Activity   Alcohol  use: Yes    Comment: Occasionally   Drug use: No   Sexual activity: Yes    Partners: Male    Birth control/protection: None  Other Topics Concern   Not on file  Social History Narrative   Not on file   Social Determinants of Health   Financial Resource Strain: Not on file  Food Insecurity: Not on file  Transportation Needs: Not on file  Physical Activity: Not on file  Stress: Not on file  Social Connections: Not on file    Family History  Problem Relation Age of Onset   Hypertension Mother    Aneurysm Mother    Anxiety disorder Mother    Arthritis Mother    Depression Mother    Hypertension Father    Arthritis Father    Cancer Maternal Grandmother    Diabetes Maternal Grandmother    Kidney disease Maternal Grandmother    Vision loss Maternal Grandmother    Varicose Veins Maternal Grandmother    Asthma Maternal Grandfather    Heart disease Paternal Grandfather     Health Maintenance  Topic Date Due   PAP SMEAR-Modifier  01/25/2023 (Originally 07/02/2009)   COVID-19 Vaccine (3 - Booster for Janssen series) 01/25/2023 (Originally 02/05/2021)   INFLUENZA VACCINE  03/25/2023 (Originally 07/25/2022)   TETANUS/TDAP  08/23/2023 (Originally  07/03/2007)   Hepatitis C Screening  08/23/2023 (Originally 07/02/2006)   HIV Screening  08/23/2023 (Originally 07/03/2003)   HPV VACCINES  Aged Out     ----------------------------------------------------------------------------------------------------------------------------------------------------------------------------------------------------------------- Physical Exam BP (!) 143/91 (BP Location: Left Arm, Patient Position: Sitting, Cuff Size: Large)   Pulse 92   Ht 5\' 5"  (1.651 m)   Wt 227 lb (103 kg)   SpO2 100%   BMI 37.77 kg/m   Physical Exam Constitutional:      Appearance: Normal appearance.  Eyes:     General: No scleral icterus. Cardiovascular:     Rate and Rhythm: Normal rate and regular rhythm.  Pulmonary:      Effort: Pulmonary effort is normal.     Breath sounds: Normal breath sounds.  Musculoskeletal:     Cervical back: Neck supple.  Neurological:     General: No focal deficit present.     Mental Status: She is alert.  Psychiatric:        Mood and Affect: Mood normal.        Behavior: Behavior normal.     ------------------------------------------------------------------------------------------------------------------------------------------------------------------------------------------------------------------- Assessment and Plan  Hypertension Blood pressure elevated today.  Recommend monitoring blood pressure at home over the next couple weeks.  Hypothyroidism due to Hashimoto's thyroiditis Elevated TSH with low T4 levels previously.  Updated TSH and T4 levels.  Will forward to endocrinologist.  Lower extremity edema Continues to have lower extremity edema.  This is likely related to her thyroid.  Swelling of bilateral have less concern for DVT however she is having some increased dyspnea as well as pleuritic chest pain.  We will check a D-dimer.  Plan to obtain CT angio of the chest for evaluation of PE if this returns positive.   No orders of the defined types were placed in this encounter.   No follow-ups on file.    This visit occurred during the SARS-CoV-2 public health emergency.  Safety protocols were in place, including screening questions prior to the visit, additional usage of staff PPE, and extensive cleaning of exam room while observing appropriate contact time as indicated for disinfecting solutions.

## 2022-08-22 NOTE — Assessment & Plan Note (Signed)
Blood pressure elevated today.  Recommend monitoring blood pressure at home over the next couple weeks.

## 2022-08-22 NOTE — Assessment & Plan Note (Signed)
Continues to have lower extremity edema.  This is likely related to her thyroid.  Swelling of bilateral have less concern for DVT however she is having some increased dyspnea as well as pleuritic chest pain.  We will check a D-dimer.  Plan to obtain CT angio of the chest for evaluation of PE if this returns positive.

## 2022-08-22 NOTE — Telephone Encounter (Signed)
Received after hours call related to critical lab result with elevated D-dimer at 0.89.  On review of chart, it does appear that her PCP, Dr. Everrett Coombe, did reach out to her to review lab results and discuss next steps including stat CT scan.  I did contact patient and reviewed recent lab results as well as plan to obtain imaging in the morning.  She voiced understanding of need for imaging in the morning and did not have any further questions at this time.  Did discuss that if she has any worsening symptoms, to present to the emergency department for further evaluation.

## 2022-08-23 ENCOUNTER — Ambulatory Visit (INDEPENDENT_AMBULATORY_CARE_PROVIDER_SITE_OTHER): Payer: No Typology Code available for payment source

## 2022-08-23 DIAGNOSIS — R071 Chest pain on breathing: Secondary | ICD-10-CM

## 2022-08-23 LAB — HEMOGLOBIN A1C
Hgb A1c MFr Bld: 4.9 % of total Hgb (ref <5.7)
Mean Plasma Glucose: 94 mg/dL
eAG (mmol/L): 5.2 mmol/L

## 2022-08-23 LAB — COMPLETE METABOLIC PANEL WITH GFR
AG Ratio: 1.9 (calc) (ref 1.0–2.5)
ALT: 23 U/L (ref 6–29)
AST: 21 U/L (ref 10–30)
Albumin: 4.4 g/dL (ref 3.6–5.1)
Alkaline phosphatase (APISO): 51 U/L (ref 31–125)
BUN/Creatinine Ratio: 6 (calc) (ref 6–22)
BUN: 5 mg/dL — ABNORMAL LOW (ref 7–25)
CO2: 25 mmol/L (ref 20–32)
Calcium: 8.7 mg/dL (ref 8.6–10.2)
Chloride: 107 mmol/L (ref 98–110)
Creat: 0.85 mg/dL (ref 0.50–0.97)
Globulin: 2.3 g/dL (calc) (ref 1.9–3.7)
Glucose, Bld: 84 mg/dL (ref 65–139)
Potassium: 4.4 mmol/L (ref 3.5–5.3)
Sodium: 139 mmol/L (ref 135–146)
Total Bilirubin: 0.4 mg/dL (ref 0.2–1.2)
Total Protein: 6.7 g/dL (ref 6.1–8.1)
eGFR: 92 mL/min/{1.73_m2} (ref >=60)

## 2022-08-23 LAB — CBC WITH DIFFERENTIAL/PLATELET
Absolute Monocytes: 406 cells/uL (ref 200–950)
Basophils Absolute: 31 cells/uL (ref 0–200)
Basophils Relative: 1 %
Eosinophils Absolute: 50 cells/uL (ref 15–500)
Eosinophils Relative: 1.6 %
HCT: 33.2 % — ABNORMAL LOW (ref 35.0–45.0)
Hemoglobin: 11.1 g/dL — ABNORMAL LOW (ref 11.7–15.5)
Lymphs Abs: 890 cells/uL (ref 850–3900)
MCH: 29.8 pg (ref 27.0–33.0)
MCHC: 33.4 g/dL (ref 32.0–36.0)
MCV: 89 fL (ref 80.0–100.0)
MPV: 8.8 fL (ref 7.5–12.5)
Monocytes Relative: 13.1 %
Neutro Abs: 1724 cells/uL (ref 1500–7800)
Neutrophils Relative %: 55.6 %
Platelets: 269 10*3/uL (ref 140–400)
RBC: 3.73 10*6/uL — ABNORMAL LOW (ref 3.80–5.10)
RDW: 12.4 % (ref 11.0–15.0)
Total Lymphocyte: 28.7 %
WBC: 3.1 10*3/uL — ABNORMAL LOW (ref 3.8–10.8)

## 2022-08-23 LAB — BRAIN NATRIURETIC PEPTIDE: Brain Natriuretic Peptide: 23 pg/mL (ref <100)

## 2022-08-23 LAB — D-DIMER, QUANTITATIVE: D-Dimer, Quant: 0.89 mcg/mL FEU — ABNORMAL HIGH (ref <0.50)

## 2022-08-23 LAB — T4, FREE: Free T4: 0.5 ng/dL — ABNORMAL LOW (ref 0.8–1.8)

## 2022-08-23 LAB — TSH: TSH: 85.31 mIU/L — ABNORMAL HIGH

## 2022-08-23 MED ORDER — IOHEXOL 350 MG/ML SOLN
100.0000 mL | Freq: Once | INTRAVENOUS | Status: AC | PRN
  Administered 2022-08-23: 100 mL via INTRAVENOUS

## 2022-09-13 ENCOUNTER — Other Ambulatory Visit: Payer: Self-pay

## 2022-09-13 MED ORDER — SERTRALINE HCL 50 MG PO TABS: 50.0000 mg | ORAL_TABLET | Freq: Every day | ORAL | 1 refills | Status: DC

## 2022-10-10 ENCOUNTER — Ambulatory Visit (INDEPENDENT_AMBULATORY_CARE_PROVIDER_SITE_OTHER): Payer: No Typology Code available for payment source | Admitting: Family Medicine

## 2022-10-10 ENCOUNTER — Encounter (INDEPENDENT_AMBULATORY_CARE_PROVIDER_SITE_OTHER): Payer: No Typology Code available for payment source | Admitting: Family Medicine

## 2022-10-10 ENCOUNTER — Encounter: Payer: Self-pay | Admitting: Family Medicine

## 2022-10-10 VITALS — BP 150/98 | HR 75 | Ht 65.0 in | Wt 232.0 lb

## 2022-10-10 DIAGNOSIS — E038 Other specified hypothyroidism: Secondary | ICD-10-CM

## 2022-10-10 DIAGNOSIS — M7989 Other specified soft tissue disorders: Secondary | ICD-10-CM

## 2022-10-10 DIAGNOSIS — E063 Autoimmune thyroiditis: Secondary | ICD-10-CM | POA: Diagnosis not present

## 2022-10-10 DIAGNOSIS — R600 Localized edema: Secondary | ICD-10-CM | POA: Diagnosis not present

## 2022-10-10 MED ORDER — LEVOTHYROXINE SODIUM 300 MCG PO TABS: 300.0000 ug | ORAL_TABLET | Freq: Every day | ORAL | 3 refills | Status: DC

## 2022-10-10 MED ORDER — SYNTHROID 300 MCG PO TABS: 300.0000 ug | ORAL_TABLET | Freq: Every day | ORAL | 3 refills | Status: DC

## 2022-10-10 NOTE — Progress Notes (Signed)
Acute Office Visit  Subjective:     Patient ID: Heather Krause, female    DOB: [birth year], 36 y.o.   MRN: [id number]  Chief Complaint  Patient presents with   Leg Swelling   Foot Swelling    HPI Patient is in today for swelling of lower extremities including legs, ankles, and feet. On chart review she does not seem to be on anything that could be causing lower extremity swelling. She has a hx of Hashimoto and is followed by endocrine. She notices intermittent swelling of her lower extremity. She travels for [place] [employer] and traveled last weekend which is when she first started to notice her legs swelling.   Her thyroid medication has not changed since TSH was done and elevated at 85.   Review of Systems  Constitutional:  Negative for chills and fever.  Respiratory:  Negative for cough and shortness of breath.   Cardiovascular:  Positive for leg swelling. Negative for chest pain.  Neurological:  Negative for headaches.        Objective:    BP (!) 150/98   Pulse 75   Ht 5\' 5"  (1.651 m)   Wt 232 lb (105.2 kg)   SpO2 100%   BMI 38.61 kg/m    Physical Exam Vitals and nursing note reviewed.  Constitutional:      General: She is not in acute distress.    Appearance: Normal appearance.  HENT:     Head: Normocephalic and atraumatic.     Right Ear: External ear normal.     Left Ear: External ear normal.     Nose: Nose normal.  Eyes:     Conjunctiva/sclera: Conjunctivae normal.  Pulmonary:     Effort: Pulmonary effort is normal.  Neurological:     General: No focal deficit present.     Mental Status: She is alert and oriented to person, place, and time.  Psychiatric:        Mood and Affect: Mood normal.        Behavior: Behavior normal.        Thought Content: Thought content normal.        Judgment: Judgment normal.     No results found for any visits on 10/10/22.      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 Have sent to pharmacy - sent in new referral to endocrine since patient's insurance has changed  - will follow up with Dr. for repeat TSH to check efficacy of medication change      Relevant Medications   levothyroxine (SYNTHROID) 300 MCG tablet   Other Relevant Orders   Ambulatory referral to Endocrinology     Other   Lower extremity edema    - likely related to uncontrolled hypothyroidism - recommended supportive care such as compression stockings, leg elevation above heart level, increase water intake, decrease salt intake      Other Visit Diagnoses     Leg swelling       Relevant Medications   levothyroxine (SYNTHROID) 300 MCG tablet       Meds ordered this encounter  Medications   levothyroxine (SYNTHROID) 300 MCG tablet    Sig: Take 1 tablet (300 mcg total) by mouth daily before breakfast.    Dispense:  30 tablet  Refill:  3    Prefer brand medicine synthroid    Return in about 6 weeks (around 11/21/2022) for with Dr. Zigmund Daniel.  Owens Loffler, DO

## 2022-10-10 NOTE — Assessment & Plan Note (Addendum)
-   increase meds to 367mcg and pt preferred brand synthroid. Have sent to pharmacy - sent in new referral to endocrine since patient's insurance has changed  - will follow up with Dr. Zigmund Daniel for repeat TSH to check efficacy of medication change

## 2022-10-10 NOTE — Assessment & Plan Note (Addendum)
-   likely related to uncontrolled hypothyroidism - recommended supportive care such as compression stockings, leg elevation above heart level, increase water intake, decrease salt intake

## 2022-10-11 NOTE — Telephone Encounter (Signed)

## 2022-10-25 ENCOUNTER — Ambulatory Visit: Payer: No Typology Code available for payment source | Admitting: Family Medicine

## 2022-11-21 ENCOUNTER — Ambulatory Visit (INDEPENDENT_AMBULATORY_CARE_PROVIDER_SITE_OTHER): Payer: No Typology Code available for payment source | Admitting: Family Medicine

## 2022-11-21 ENCOUNTER — Encounter: Payer: Self-pay | Admitting: Family Medicine

## 2022-11-21 VITALS — BP 141/83 | HR 68 | Ht 65.0 in | Wt 228.1 lb

## 2022-11-21 DIAGNOSIS — E038 Other specified hypothyroidism: Secondary | ICD-10-CM | POA: Diagnosis not present

## 2022-11-21 DIAGNOSIS — R600 Localized edema: Secondary | ICD-10-CM

## 2022-11-21 DIAGNOSIS — I10 Essential (primary) hypertension: Secondary | ICD-10-CM | POA: Diagnosis not present

## 2022-11-21 DIAGNOSIS — E063 Autoimmune thyroiditis: Secondary | ICD-10-CM | POA: Diagnosis not present

## 2022-11-21 MED ORDER — NORETHINDRONE ACETATE 5 MG PO TABS: 5.0000 mg | ORAL_TABLET | Freq: Every day | ORAL | 1 refills | Status: DC

## 2022-11-21 MED ORDER — FUROSEMIDE 20 MG PO TABS: 20.0000 mg | ORAL_TABLET | Freq: Every day | ORAL | 0 refills | Status: DC

## 2022-11-21 MED ORDER — SERTRALINE HCL 50 MG PO TABS: 50.0000 mg | ORAL_TABLET | Freq: Every day | ORAL | 1 refills | Status: DC

## 2022-11-21 NOTE — Progress Notes (Signed)
Heather Krause - 36 y.o. female MRN [id number]  Date of birth: March 28, 1986  Subjective Chief Complaint  Patient presents with   Edema    HPI Heather Krause is a 36 year old female here today for follow-up visit.  She was seen a few weeks ago by Dr. Tamera Punt for increased swelling in her lower extremities.  TSH has been elevated.  She was being seen by endocrinology however adjustments to her medications were not being made so we have taken these over.  Previous TSH of 85.31.  Synthroid increased to 300 mcg.  Additionally she is on Armour Thyroid 30 mg daily.  She is taking this first thing in the morning on an empty stomach.  She reports since adjustments were made to her levothyroxine she has not really noticed any significant improvement in her swelling.  She does try to elevate her legs but this does not seem to help a whole lot of swelling either.  This is worsened when sitting for prolonged periods of time.  No dyspnea or shortness of breath.  ROS:  A comprehensive ROS was completed and negative except as noted per HPI  Allergies  Allergen Reactions   Bee Venom Anaphylaxis   Bisoprolol-Hydrochlorothiazide Swelling   Diclofenac Sodium Other (See Comments)   Fluarix Quadrivalent [Influenza Vac Split Quad] Other (See Comments)    Autoimmune response   Latex Rash    Past Medical History:  Diagnosis Date   Allergy    Latex   Anxiety    Asthma    Depression    GERD (gastroesophageal reflux disease)    Hypertension    Thyroid disease     Past Surgical History:  Procedure Laterality Date   CHOLECYSTECTOMY     ESOPHAGUS SURGERY      Social History   Socioeconomic History   Marital status: Married    Spouse name: Not on file   Number of children: Not on file   Years of education: Not on file   Highest education level: Not on file  Occupational History   Not on file  Tobacco Use   Smoking status: Never    Passive exposure: Never   Smokeless tobacco: Never  Vaping Use    Vaping Use: Never used  Substance and Sexual Activity   Alcohol use: Yes    Comment: Occasionally   Drug use: No   Sexual activity: Yes    Partners: Male    Birth control/protection: None  Other Topics Concern   Not on file  Social History Narrative   Not on file   Social Determinants of Health   Financial Resource Strain: Not on file  Food Insecurity: Not on file  Transportation Needs: Not on file  Physical Activity: Not on file  Stress: Not on file  Social Connections: Not on file    Family History  Problem Relation Age of Onset   Hypertension Mother    Aneurysm Mother    Anxiety disorder Mother    Arthritis Mother    Depression Mother    Hypertension Father    Arthritis Father    Cancer Maternal Grandmother    Diabetes Maternal Grandmother    Kidney disease Maternal Grandmother    Vision loss Maternal Grandmother    Varicose Veins Maternal Grandmother    Asthma Maternal Grandfather    Heart disease Paternal Grandfather     Health Maintenance  Topic Date Due   PAP SMEAR-Modifier  01/25/2023 (Originally 07/02/2009)   COVID-19 Vaccine (3 - 2023-24 season) 01/25/2023 (Originally  08/25/2022)   INFLUENZA VACCINE  03/25/2023 (Originally 07/25/2022)   Hepatitis C Screening  08/23/2023 (Originally 07/02/2006)   HIV Screening  08/23/2023 (Originally 07/03/2003)   HPV VACCINES  Aged Out     ----------------------------------------------------------------------------------------------------------------------------------------------------------------------------------------------------------------- Physical Exam BP (!) 141/83 (BP Location: Left Arm, Patient Position: Sitting, Cuff Size: Large)   Pulse 68   Ht 5\' 5"  (1.651 m)   Wt 228 lb 1.9 oz (103.5 kg)   SpO2 100%   BMI 37.96 kg/m   Physical Exam Constitutional:      Appearance: Normal appearance.  HENT:     Head: Normocephalic and atraumatic.  Musculoskeletal:     Comments: 1+ bilateral lower extremity edema.   Neurological:     Mental Status: She is alert.     ------------------------------------------------------------------------------------------------------------------------------------------------------------------------------------------------------------------- Assessment and Plan  Hypothyroidism due to Hashimoto's thyroiditis TSH has remained elevated.  Levothyroxine adjusted previously.  Updating TSH we will make further adjustments as needed.  I still think this is contributing to her lower extremity edema.  Lower extremity edema Likely related to her hypothyroidism.  Additionally encouraged her to continue with elevation.  Compression stockings may be helpful as well.  Adding low strength Lasix daily.  Recheck potassium in 1 week.   Meds ordered this encounter  Medications   furosemide (LASIX) 20 MG tablet    Sig: Take 1 tablet (20 mg total) by mouth daily.    Dispense:  30 tablet    Refill:  0   norethindrone (AYGESTIN) 5 MG tablet    Sig: Take 1 tablet (5 mg total) by mouth daily.    Dispense:  90 tablet    Refill:  1   sertraline (ZOLOFT) 50 MG tablet    Sig: Take 1 tablet (50 mg total) by mouth daily.    Dispense:  90 tablet    Refill:  1    No follow-ups on file.    This visit occurred during the SARS-CoV-2 public health emergency.  Safety protocols were in place, including screening questions prior to the visit, additional usage of staff PPE, and extensive cleaning of exam room while observing appropriate contact time as indicated for disinfecting solutions.

## 2022-11-21 NOTE — Assessment & Plan Note (Signed)
TSH has remained elevated.  Levothyroxine adjusted previously.  Updating TSH we will make further adjustments as needed.  I still think this is contributing to her lower extremity edema.

## 2022-11-21 NOTE — Assessment & Plan Note (Signed)
Likely related to her hypothyroidism.  Additionally encouraged her to continue with elevation.  Compression stockings may be helpful as well.  Adding low strength Lasix daily.  Recheck potassium in 1 week.

## 2022-11-29 ENCOUNTER — Other Ambulatory Visit: Payer: Self-pay | Admitting: Family Medicine

## 2022-11-29 ENCOUNTER — Encounter: Payer: Self-pay | Admitting: Family Medicine

## 2022-11-29 DIAGNOSIS — M7989 Other specified soft tissue disorders: Secondary | ICD-10-CM

## 2022-11-29 DIAGNOSIS — E038 Other specified hypothyroidism: Secondary | ICD-10-CM

## 2022-11-29 LAB — BASIC METABOLIC PANEL
BUN: 8 mg/dL (ref 7–25)
CO2: 26 mmol/L (ref 20–32)
Calcium: 8.5 mg/dL — ABNORMAL LOW (ref 8.6–10.2)
Chloride: 107 mmol/L (ref 98–110)
Creat: 0.79 mg/dL (ref 0.50–0.97)
Glucose, Bld: 114 mg/dL — ABNORMAL HIGH (ref 65–99)
Potassium: 4.6 mmol/L (ref 3.5–5.3)
Sodium: 139 mmol/L (ref 135–146)

## 2022-11-29 LAB — TSH: TSH: 38.49 mIU/L — ABNORMAL HIGH

## 2022-11-29 MED ORDER — SYNTHROID 300 MCG PO TABS: 300.0000 ug | ORAL_TABLET | Freq: Every day | ORAL | 0 refills | Status: DC

## 2022-11-29 MED ORDER — THYROID 60 MG PO TABS
60.0000 mg | ORAL_TABLET | Freq: Every day | ORAL | 0 refills | Status: DC
Start: 2022-11-29 — End: 2022-12-19

## 2022-11-29 NOTE — Telephone Encounter (Signed)
Can we see if [facility] or [facility] has sooner availability?

## 2022-12-01 NOTE — Telephone Encounter (Signed)
Called first thing this morning, [facility] Sprang states they are out to Late February/Early March and [facility]'s New Patient Coordinator is out of the office until noon so I won't know until Monday, assuming they call back as requested.

## 2022-12-05 NOTE — Telephone Encounter (Signed)
Received message back from [facility], they are scheduling out til May 2024 for New Patients. Heather Krause

## 2022-12-06 DIAGNOSIS — M19019 Primary osteoarthritis, unspecified shoulder: Secondary | ICD-10-CM | POA: Insufficient documentation

## 2022-12-11 NOTE — Telephone Encounter (Signed)
Sent to [facility] per provider. Katha Hamming

## 2022-12-17 ENCOUNTER — Other Ambulatory Visit: Payer: Self-pay | Admitting: Family Medicine

## 2022-12-19 ENCOUNTER — Encounter: Payer: Self-pay | Admitting: Family Medicine

## 2022-12-20 ENCOUNTER — Ambulatory Visit (INDEPENDENT_AMBULATORY_CARE_PROVIDER_SITE_OTHER): Payer: No Typology Code available for payment source | Admitting: Medical-Surgical

## 2022-12-20 VITALS — Temp 97.7°F | Ht 65.0 in | Wt 228.1 lb

## 2022-12-20 DIAGNOSIS — Z23 Encounter for immunization: Secondary | ICD-10-CM | POA: Diagnosis not present

## 2022-12-20 NOTE — Progress Notes (Signed)
Agree with documentation as below.  ___________________________________________ [name] L. Detric Scalisi, DNP, APRN, FNP-BC Primary Care and Sports Medicine [facility]   

## 2022-12-20 NOTE — Progress Notes (Signed)
Pt presents to day for immunization. No allergy to eggs or latex.  Pt was bitten by her dog while administering ear infection medication.   Location: RD  Pt tolerated well.

## 2023-01-24 ENCOUNTER — Other Ambulatory Visit: Payer: Self-pay | Admitting: Family Medicine

## 2023-01-24 DIAGNOSIS — E038 Other specified hypothyroidism: Secondary | ICD-10-CM

## 2023-01-24 NOTE — Telephone Encounter (Signed)
Lab orders for TSH, Free T4 and T3 entered.

## 2023-01-25 ENCOUNTER — Other Ambulatory Visit: Payer: Self-pay | Admitting: Family Medicine

## 2023-02-04 ENCOUNTER — Other Ambulatory Visit: Payer: Self-pay | Admitting: Family Medicine

## 2023-02-13 ENCOUNTER — Encounter: Payer: Self-pay | Admitting: Family Medicine

## 2023-02-13 MED ORDER — SERTRALINE HCL 100 MG PO TABS: 100.0000 mg | ORAL_TABLET | Freq: Every day | ORAL | 1 refills | Status: DC

## 2023-02-13 NOTE — Telephone Encounter (Signed)
Patient is requesting zoloft  refill with increase suggested  to 155m be sent to [facility] [place].

## 2023-02-22 ENCOUNTER — Encounter: Payer: Self-pay | Admitting: Family Medicine

## 2023-02-22 ENCOUNTER — Ambulatory Visit (INDEPENDENT_AMBULATORY_CARE_PROVIDER_SITE_OTHER): Payer: No Typology Code available for payment source | Admitting: Family Medicine

## 2023-02-22 VITALS — BP 154/97 | HR 74 | Ht 65.0 in | Wt 224.0 lb

## 2023-02-22 DIAGNOSIS — I10 Essential (primary) hypertension: Secondary | ICD-10-CM

## 2023-02-22 DIAGNOSIS — R7309 Other abnormal glucose: Secondary | ICD-10-CM

## 2023-02-22 DIAGNOSIS — R000 Tachycardia, unspecified: Secondary | ICD-10-CM

## 2023-02-22 DIAGNOSIS — R55 Syncope and collapse: Secondary | ICD-10-CM | POA: Diagnosis not present

## 2023-02-22 DIAGNOSIS — E038 Other specified hypothyroidism: Secondary | ICD-10-CM

## 2023-02-22 DIAGNOSIS — E282 Polycystic ovarian syndrome: Secondary | ICD-10-CM | POA: Diagnosis not present

## 2023-02-22 DIAGNOSIS — E063 Autoimmune thyroiditis: Secondary | ICD-10-CM

## 2023-02-22 NOTE — Assessment & Plan Note (Signed)
She will continue to see her endocrinologist for management.  Tirosint recently increased.

## 2023-02-22 NOTE — Assessment & Plan Note (Addendum)
EKG completed today showing normal sinus rhythm.  Normal PR and QTc intervals.  She has had presyncopal episodes as well.  Checking labs including CMP, CBC, magnesium, iron panel.  Echocardiogram ordered.  Checking A1c as well has insulin levels to see if presyncopal episodes may be hypoglycemic related.

## 2023-02-22 NOTE — Progress Notes (Signed)
Heather Krause - 37 y.o. female MRN [id number]  Date of birth: 1986-09-14  Subjective Chief Complaint  Patient presents with   Tachycardia   Hypertension   Near Syncope   Iron Deficiency    HPI Heather Krause is a 37 year old female here today with complaint of elevated heart rate.  She has noticed elevated heart rate based on readings from her smart watch.  She also has noted episodes of lightheadedness, feel like she is going to pass out.  She has been working with her endocrinologist to get her thyroid under better control.  Her recent TSH remained elevated with borderline low normal free T4.  Her Tirosint was increased again to 200 mcg.  She has noted some palpitations at times.  Denies chest pain or chest tightness.  She continues to have some swelling in her legs, this has improved some with adjustment of her thyroid medication.  She does note that she is craving ice more frequently as well.  ROS:  A comprehensive ROS was completed and negative except as noted per HPI  Allergies  Allergen Reactions   Bee Venom Anaphylaxis   Bisoprolol-Hydrochlorothiazide Swelling   Diclofenac Sodium Other (See Comments)   Fluarix Quadrivalent [Influenza Vac Split Quad] Other (See Comments)    Autoimmune response   Latex Rash    Past Medical History:  Diagnosis Date   Allergy    Latex   Anxiety    Asthma    Depression    GERD (gastroesophageal reflux disease)    Hypertension    Thyroid disease     Past Surgical History:  Procedure Laterality Date   CHOLECYSTECTOMY     ESOPHAGUS SURGERY      Social History   Socioeconomic History   Marital status: Married    Spouse name: Not on file   Number of children: Not on file   Years of education: Not on file   Highest education level: Not on file  Occupational History   Not on file  Tobacco Use   Smoking status: Never    Passive exposure: Never   Smokeless tobacco: Never  Vaping Use   Vaping Use: Never used  Substance and Sexual  Activity   Alcohol use: Yes    Comment: Occasionally   Drug use: No   Sexual activity: Yes    Partners: Male    Birth control/protection: None  Other Topics Concern   Not on file  Social History Narrative   Not on file   Social Determinants of Health   Financial Resource Strain: Not on file  Food Insecurity: Not on file  Transportation Needs: Not on file  Physical Activity: Not on file  Stress: Not on file  Social Connections: Not on file    Family History  Problem Relation Age of Onset   Hypertension Mother    Aneurysm Mother    Anxiety disorder Mother    Arthritis Mother    Depression Mother    Hypertension Father    Arthritis Father    Cancer Maternal Grandmother    Diabetes Maternal Grandmother    Kidney disease Maternal Grandmother    Vision loss Maternal Grandmother    Varicose Veins Maternal Grandmother    Asthma Maternal Grandfather    Heart disease Paternal Grandfather     Health Maintenance  Topic Date Due   INFLUENZA VACCINE  03/25/2023 (Originally 07/25/2022)   PAP SMEAR-Modifier  06/25/2023 (Originally 07/02/2009)   Hepatitis C Screening  08/23/2023 (Originally 07/02/2006)   HIV Screening  08/23/2023 (Originally 07/03/2003)   COVID-19 Vaccine (3 - 2023-24 season) 03/09/2024 (Originally 08/25/2022)   DTaP/Tdap/Td (2 - Td or Tdap) 12/20/2032   HPV VACCINES  Aged Out     ----------------------------------------------------------------------------------------------------------------------------------------------------------------------------------------------------------------- Physical Exam BP (!) 154/97 (BP Location: Left Arm, Patient Position: Sitting, Cuff Size: Large)   Pulse 74   Ht '5\' 5"'$  (1.651 m)   Wt 224 lb (101.6 kg)   SpO2 99%   BMI 37.28 kg/m   Physical Exam Constitutional:      Appearance: Normal appearance.  HENT:     Head: Normocephalic and atraumatic.  Eyes:     General: No scleral icterus. Cardiovascular:     Rate and Rhythm:  Normal rate and regular rhythm.  Pulmonary:     Effort: Pulmonary effort is normal.     Breath sounds: Normal breath sounds.  Musculoskeletal:     Cervical back: Neck supple.  Neurological:     Mental Status: She is alert.  Psychiatric:        Mood and Affect: Mood normal.        Behavior: Behavior normal.   EKG: Normal sinus rhythm.  ------------------------------------------------------------------------------------------------------------------------------------------------------------------------------------------------------------------- Assessment and Plan  Hypothyroidism due to Hashimoto's thyroiditis She will continue to see her endocrinologist for management.  Tirosint recently increased.  Tachycardia EKG completed today showing normal sinus rhythm.  Normal PR and QTc intervals.  She has had presyncopal episodes as well.  Checking labs including CMP, CBC, magnesium, iron panel.  Echocardiogram ordered.  Checking A1c as well has insulin levels to see if presyncopal episodes may be hypoglycemic related.   No orders of the defined types were placed in this encounter.   No follow-ups on file.    This visit occurred during the SARS-CoV-2 public health emergency.  Safety protocols were in place, including screening questions prior to the visit, additional usage of staff PPE, and extensive cleaning of exam room while observing appropriate contact time as indicated for disinfecting solutions.

## 2023-02-23 ENCOUNTER — Other Ambulatory Visit: Payer: Self-pay | Admitting: Family Medicine

## 2023-02-23 DIAGNOSIS — D509 Iron deficiency anemia, unspecified: Secondary | ICD-10-CM

## 2023-02-23 DIAGNOSIS — R000 Tachycardia, unspecified: Secondary | ICD-10-CM

## 2023-02-23 LAB — CBC WITH DIFFERENTIAL/PLATELET
Absolute Monocytes: 684 cells/uL (ref 200–950)
Basophils Absolute: 48 cells/uL (ref 0–200)
Basophils Relative: 0.9 %
Eosinophils Absolute: 80 cells/uL (ref 15–500)
Eosinophils Relative: 1.5 %
HCT: 31.9 % — ABNORMAL LOW (ref 35.0–45.0)
Hemoglobin: 9.9 g/dL — ABNORMAL LOW (ref 11.7–15.5)
Lymphs Abs: 1675 cells/uL (ref 850–3900)
MCH: 25.4 pg — ABNORMAL LOW (ref 27.0–33.0)
MCHC: 31 g/dL — ABNORMAL LOW (ref 32.0–36.0)
MCV: 81.8 fL (ref 80.0–100.0)
MPV: 9.4 fL (ref 7.5–12.5)
Monocytes Relative: 12.9 %
Neutro Abs: 2814 cells/uL (ref 1500–7800)
Neutrophils Relative %: 53.1 %
Platelets: 445 10*3/uL — ABNORMAL HIGH (ref 140–400)
RBC: 3.9 10*6/uL (ref 3.80–5.10)
RDW: 13.9 % (ref 11.0–15.0)
Total Lymphocyte: 31.6 %
WBC: 5.3 10*3/uL (ref 3.8–10.8)

## 2023-02-23 LAB — HEMOGLOBIN A1C
Hgb A1c MFr Bld: 5.3 % of total Hgb (ref <5.7)
Mean Plasma Glucose: 105 mg/dL
eAG (mmol/L): 5.8 mmol/L

## 2023-02-23 LAB — COMPLETE METABOLIC PANEL WITH GFR
AG Ratio: 1.5 (calc) (ref 1.0–2.5)
ALT: 21 U/L (ref 6–29)
AST: 15 U/L (ref 10–30)
Albumin: 4.4 g/dL (ref 3.6–5.1)
Alkaline phosphatase (APISO): 57 U/L (ref 31–125)
BUN: 10 mg/dL (ref 7–25)
CO2: 24 mmol/L (ref 20–32)
Calcium: 9.8 mg/dL (ref 8.6–10.2)
Chloride: 106 mmol/L (ref 98–110)
Creat: 0.7 mg/dL (ref 0.50–0.97)
Globulin: 3 g/dL (calc) (ref 1.9–3.7)
Glucose, Bld: 87 mg/dL (ref 65–99)
Potassium: 4.8 mmol/L (ref 3.5–5.3)
Sodium: 140 mmol/L (ref 135–146)
Total Bilirubin: 0.3 mg/dL (ref 0.2–1.2)
Total Protein: 7.4 g/dL (ref 6.1–8.1)
eGFR: 116 mL/min/{1.73_m2} (ref >=60)

## 2023-02-23 LAB — IRON,TIBC AND FERRITIN PANEL
%SAT: 4 % (calc) — ABNORMAL LOW (ref 16–45)
Ferritin: 3 ng/mL — ABNORMAL LOW (ref 16–154)
Iron: 18 ug/dL — ABNORMAL LOW (ref 40–190)
TIBC: 480 mcg/dL (calc) — ABNORMAL HIGH (ref 250–450)

## 2023-02-23 LAB — MAGNESIUM: Magnesium: 2.1 mg/dL (ref 1.5–2.5)

## 2023-02-23 LAB — INSULIN, RANDOM: Insulin: 16.2 u[IU]/mL

## 2023-02-27 ENCOUNTER — Other Ambulatory Visit: Payer: Self-pay

## 2023-02-27 ENCOUNTER — Inpatient Hospital Stay: Payer: No Typology Code available for payment source | Attending: Hematology & Oncology

## 2023-02-27 ENCOUNTER — Inpatient Hospital Stay: Payer: No Typology Code available for payment source | Admitting: Medical Oncology

## 2023-02-27 ENCOUNTER — Encounter: Payer: Self-pay | Admitting: Medical Oncology

## 2023-02-27 VITALS — BP 149/100 | HR 84 | Temp 99.1°F | Resp 18 | Ht 65.0 in | Wt 227.0 lb

## 2023-02-27 DIAGNOSIS — K219 Gastro-esophageal reflux disease without esophagitis: Secondary | ICD-10-CM | POA: Diagnosis not present

## 2023-02-27 DIAGNOSIS — R5383 Other fatigue: Secondary | ICD-10-CM | POA: Diagnosis not present

## 2023-02-27 DIAGNOSIS — D649 Anemia, unspecified: Secondary | ICD-10-CM

## 2023-02-27 DIAGNOSIS — I10 Essential (primary) hypertension: Secondary | ICD-10-CM | POA: Insufficient documentation

## 2023-02-27 DIAGNOSIS — D509 Iron deficiency anemia, unspecified: Secondary | ICD-10-CM

## 2023-02-27 DIAGNOSIS — R42 Dizziness and giddiness: Secondary | ICD-10-CM | POA: Insufficient documentation

## 2023-02-27 LAB — CBC WITH DIFFERENTIAL (CANCER CENTER ONLY)
Abs Immature Granulocytes: 0.01 10*3/uL (ref 0.00–0.07)
Basophils Absolute: 0 10*3/uL (ref 0.0–0.1)
Basophils Relative: 1 %
Eosinophils Absolute: 0.1 10*3/uL (ref 0.0–0.5)
Eosinophils Relative: 1 %
HCT: 31.5 % — ABNORMAL LOW (ref 36.0–46.0)
Hemoglobin: 9.4 g/dL — ABNORMAL LOW (ref 12.0–15.0)
Immature Granulocytes: 0 %
Lymphocytes Relative: 34 %
Lymphs Abs: 1.8 10*3/uL (ref 0.7–4.0)
MCH: 24.9 pg — ABNORMAL LOW (ref 26.0–34.0)
MCHC: 29.8 g/dL — ABNORMAL LOW (ref 30.0–36.0)
MCV: 83.6 fL (ref 80.0–100.0)
Monocytes Absolute: 0.5 10*3/uL (ref 0.1–1.0)
Monocytes Relative: 10 %
Neutro Abs: 2.9 10*3/uL (ref 1.7–7.7)
Neutrophils Relative %: 54 %
Platelet Count: 380 10*3/uL (ref 150–400)
RBC: 3.77 MIL/uL — ABNORMAL LOW (ref 3.87–5.11)
RDW: 13.5 % (ref 11.5–15.5)
WBC Count: 5.4 10*3/uL (ref 4.0–10.5)
nRBC: 0 % (ref 0.0–0.2)

## 2023-02-27 LAB — FERRITIN: Ferritin: 3 ng/mL — ABNORMAL LOW (ref 11–307)

## 2023-02-27 LAB — VITAMIN B12: Vitamin B-12: 394 pg/mL (ref 180–914)

## 2023-02-27 LAB — RETICULOCYTES
Immature Retic Fract: 17.6 % — ABNORMAL HIGH (ref 2.3–15.9)
RBC.: 3.66 MIL/uL — ABNORMAL LOW (ref 3.87–5.11)
Retic Count, Absolute: 47.1 10*3/uL (ref 19.0–186.0)
Retic Ct Pct: 1.3 % (ref 0.4–3.1)

## 2023-02-27 LAB — CMP (CANCER CENTER ONLY)
ALT: 21 U/L (ref 0–44)
AST: 14 U/L — ABNORMAL LOW (ref 15–41)
Albumin: 4.7 g/dL (ref 3.5–5.0)
Alkaline Phosphatase: 60 U/L (ref 38–126)
Anion gap: 10 (ref 5–15)
BUN: 8 mg/dL (ref 6–20)
CO2: 26 mmol/L (ref 22–32)
Calcium: 10 mg/dL (ref 8.9–10.3)
Chloride: 106 mmol/L (ref 98–111)
Creatinine: 0.69 mg/dL (ref 0.44–1.00)
GFR, Estimated: >60 mL/min (ref >60)
Glucose, Bld: 137 mg/dL — ABNORMAL HIGH (ref 70–99)
Potassium: 4.1 mmol/L (ref 3.5–5.1)
Sodium: 142 mmol/L (ref 135–145)
Total Bilirubin: 0.2 mg/dL — ABNORMAL LOW (ref 0.3–1.2)
Total Protein: 7.4 g/dL (ref 6.5–8.1)

## 2023-02-27 LAB — FOLATE: Folate: 11.8 ng/mL (ref >5.9)

## 2023-02-27 LAB — SAVE SMEAR(SSMR), FOR PROVIDER SLIDE REVIEW

## 2023-02-27 MED ORDER — SUCRALFATE 1 G PO TABS: 1.0000 g | ORAL_TABLET | Freq: Three times a day (TID) | ORAL | 0 refills | Status: DC

## 2023-02-27 NOTE — Progress Notes (Signed)
[name] [phone]   [phone] [place] NOTE  Patient Care Team: Luetta Nutting, DO as PCP - General (Family Medicine)  Hematological/Oncological History # None  CHIEF COMPLAINTS/PURPOSE OF CONSULTATION:  "Fatigue/anemia "  HISTORY OF PRESENTING ILLNESS:  Heather Krause 37 y.o. female with medical history significant for.   Anemia was noticed a few months ago. No major changes to health or illness around the time that things changes.   Blood in stools:No Blood in urine: No Difficulty swallowing: GERD- has significant GERD and has had to have her esophagus stretched multiple times due to this.  Pica: Yes SOB: Yes with exertion  Fatigue: Yes Change of bowel movement/constipation: No Prior blood transfusion: No Prior history of blood loss: No- had a trauma when she was 10 but does not think she required blood Liver disease: No Bariatric surgery: No Colonoscopy/Endoscopy: Last endoscopy Dec 2023. Has not had a colonoscopy  Vaginal bleeding: None- on OCP continuous Prior evaluation with hematology: No Prior bone marrow biopsy: No Oral iron: Not currently- took it years ago with unknown history regarding anemia  Prior IV iron infusions: No Family history Anemia: maternal grandmother maybe thalassemia- No known sickle cell family history  Blood blot: 2016 right lower DVT- thought to be secondary to IVF medications.  Special diets- gluten free No upcoming planned pregnancies for right now- thinking about it in the future.   MEDICAL HISTORY:  Past Medical History:  Diagnosis Date   Allergy    Latex   Anxiety    Asthma    Depression    GERD (gastroesophageal reflux disease)    Hypertension    Thyroid disease     SURGICAL HISTORY: Past Surgical History:  Procedure Laterality Date   CHOLECYSTECTOMY     ESOPHAGUS SURGERY      SOCIAL HISTORY: Social History   Socioeconomic History   Marital status: Married     Spouse name: Not on file   Number of children: Not on file   Years of education: Not on file   Highest education level: Not on file  Occupational History   Not on file  Tobacco Use   Smoking status: Never    Passive exposure: Never   Smokeless tobacco: Never  Vaping Use   Vaping Use: Never used  Substance and Sexual Activity   Alcohol use: Yes    Comment: Occasionally   Drug use: No   Sexual activity: Yes    Partners: Male    Birth control/protection: None  Other Topics Concern   Not on file  Social History Narrative   Not on file   Social Determinants of Health   Financial Resource Strain: Not on file  Food Insecurity: No Food Insecurity (02/27/2023)   Hunger Vital Sign    Worried About Running Out of Food in the Last Year: Never true    Ran Out of Food in the Last Year: Never true  Transportation Needs: No Transportation Needs (02/27/2023)   PRAPARE - Hydrologist (Medical): No    Lack of Transportation (Non-Medical): No  Physical Activity: Not on file  Stress: Not on file  Social Connections: Not on file  Intimate Partner Violence: Not At Risk (02/27/2023)   Humiliation, Afraid, Rape, and Kick questionnaire    Fear of Current or Ex-Partner: No    Emotionally Abused: No    Physically Abused: No    Sexually Abused: No    FAMILY HISTORY: Family History  Problem Relation Age of Onset   Hypertension Mother    Aneurysm Mother    Anxiety disorder Mother    Arthritis Mother    Depression Mother    Hypertension Father    Arthritis Father    Cancer Maternal Grandmother    Diabetes Maternal Grandmother    Kidney disease Maternal Grandmother    Vision loss Maternal Grandmother    Varicose Veins Maternal Grandmother    Asthma Maternal Grandfather    Heart disease Paternal Grandfather     ALLERGIES:  is allergic to bee venom, fluarix quadrivalent [influenza vac split quad], bisoprolol-hydrochlorothiazide, diclofenac sodium, and  latex.  MEDICATIONS:  Current Outpatient Medications  Medication Sig Dispense Refill   AIMOVIG 140 MG/ML SOAJ Inject 1 mL into the skin every 30 (thirty) days.     ALPRAZolam (XANAX) 0.25 MG tablet Take 0.25 mg by mouth 2 (two) times daily as needed.     cetirizine (ZYRTEC) 10 MG tablet Take 10 mg by mouth daily.     dicyclomine (BENTYL) 10 MG capsule Take 10 mg by mouth 4 (four) times daily -  before meals and at bedtime.     Erenumab-aooe 140 MG/ML SOAJ Inject into the skin.     furosemide (LASIX) 20 MG tablet TAKE 1 TABLET(20 MG) BY MOUTH DAILY 30 tablet 0   gabapentin (NEURONTIN) 100 MG capsule Take by mouth.     Levothyroxine Sodium 200 MCG CAPS Take 200 mcg by mouth daily before breakfast.     melatonin 5 MG TABS Take by mouth.     MULTIPLE VITAMIN PO Take by mouth.     norethindrone (AYGESTIN) 5 MG tablet Take 1 tablet (5 mg total) by mouth daily. 90 tablet 1   ondansetron (ZOFRAN-ODT) 4 MG disintegrating tablet Take 1 tablet (4 mg total) by mouth every 8 (eight) hours as needed for nausea or vomiting. 20 tablet 0   pantoprazole (PROTONIX) 40 MG tablet Take 1 tablet by mouth daily.     sertraline (ZOLOFT) 100 MG tablet Take 1 tablet (100 mg total) by mouth daily. 90 tablet 1   sucralfate (CARAFATE) 1 g tablet Take 1 tablet (1 g total) by mouth 4 (four) times daily -  with meals and at bedtime for 14 days. 56 tablet 0   timolol (TIMOPTIC) 0.5 % ophthalmic solution 1 drop every morning.     UBRELVY 100 MG TABS Take by mouth.     EPINEPHrine 0.3 mg/0.3 mL IJ SOAJ injection EpiPen 2-Pak 0.3 mg/0.3 mL injection, auto-injector (Patient not taking: Reported on 02/27/2023)     No current facility-administered medications for this visit.    REVIEW OF SYSTEMS:   Constitutional: ( - ) fevers, ( - )  chills , ( - ) night sweats Eyes: ( - ) blurriness of vision, ( - ) double vision, ( - ) watery eyes Ears, nose, mouth, throat, and face: ( - ) mucositis, ( - ) sore throat Respiratory: ( - )  cough, ( - ) dyspnea, ( - ) wheezes Cardiovascular: ( - ) palpitation, ( - ) chest discomfort, ( - ) lower extremity swelling Gastrointestinal:  ( - ) nausea, ( - ) heartburn, ( - ) change in bowel habits Skin: ( - ) abnormal skin rashes Lymphatics: ( - ) new lymphadenopathy, ( - ) easy bruising Neurological: ( - ) numbness, ( - ) tingling, ( - ) new weaknesses Behavioral/Psych: ( - ) mood change, ( - ) new changes  All other systems were  reviewed with the patient and are negative.  PHYSICAL EXAMINATION: ECOG PERFORMANCE STATUS: 1 - Symptomatic but completely ambulatory  Vitals:   02/27/23 1340  BP: (!) 149/100  Pulse: 84  Resp: 18  Temp: 99.1 F (37.3 C)  SpO2: 100%   Filed Weights   02/27/23 1340  Weight: 227 lb (103 kg)    GENERAL: well appearing female in NAD  SKIN: skin color, texture, turgor are normal, no rashes or significant lesions EYES: conjunctiva are pink and non-injected, sclera clear OROPHARYNX: no exudate, no erythema; lips, buccal mucosa with moderate pallor, and tongue normal  NECK: supple, non-tender LYMPH:  no palpable lymphadenopathy in the cervical, axillary or supraclavicular lymph nodes.  LUNGS: clear to auscultation and percussion with normal breathing effort HEART: regular rate & rhythm and no murmurs and no lower extremity edema ABDOMEN: soft, non-tender, non-distended, normal bowel sounds Musculoskeletal: no cyanosis of digits and no clubbing  PSYCH: alert & oriented x 3, fluent speech NEURO: no focal motor/sensory deficits  LABORATORY DATA:  I have reviewed the data as listed    Latest Ref Rng & Units 02/27/2023    1:12 PM 02/22/2023   10:41 AM 08/22/2022   12:00 AM  CBC  WBC 4.0 - 10.5 K/uL 5.4  5.3  3.1   Hemoglobin 12.0 - 15.0 g/dL 9.4  9.9  11.1   Hematocrit 36.0 - 46.0 % 31.5  31.9  33.2   Platelets 150 - 400 K/uL 380  445  269        Latest Ref Rng & Units 02/27/2023    1:12 PM 02/22/2023   10:41 AM 11/28/2022   10:05 AM  CMP   Glucose 70 - 99 mg/dL 137  87  114   BUN 6 - 20 mg/dL '8  10  8   '$ Creatinine 0.44 - 1.00 mg/dL 0.69  0.70  0.79   Sodium 135 - 145 mmol/L 142  140  139   Potassium 3.5 - 5.1 mmol/L 4.1  4.8  4.6   Chloride 98 - 111 mmol/L 106  106  107   CO2 22 - 32 mmol/L '26  24  26   '$ Calcium 8.9 - 10.3 mg/dL 10.0  9.8  8.5   Total Protein 6.5 - 8.1 g/dL 7.4  7.4    Total Bilirubin 0.3 - 1.2 mg/dL 0.2  0.3    Alkaline Phos 38 - 126 U/L 60     AST 15 - 41 U/L 14  15    ALT 0 - 44 U/L 21  21       RADIOGRAPHIC STUDIES: I have personally reviewed the radiological images as listed and agreed with the findings in the report. No results found.  ASSESSMENT & PLAN Encounter Diagnoses  Name Primary?   Anemia, unspecified type Yes   Iron deficiency anemia, unspecified iron deficiency anemia type     Anemia: Multifactorial. Normocytic. Hgb has fallen from 9.9 to 9.4 within 5 days. Nutritional and iron storage labs pending. Has history of significant GERD. She likely has a chronic slow GI bleed. On Protonix 40 mg daily. Will add sucralfate- discussed how to use and that this is not absorbed but rather coats the GI track. She will get into see GI ASAP- she reports a good relationship with her GI office and will call them today to schedule. We will start her on IV Venofer given recent ferritin of 3, iron saturation of 4 and iron level of 18. Elevated TIBC. Discussed common potential  side effects and precautions of the IV Venofer. Also discussed that she must make [place] aware if/when she comes off of birth control as IV Iron can only be given during certain times in pregnancy. Reviewed red flags with patient and husband.   Follow up in 2 weeks or sooner if needed.  Venofer order placed and signed   Orders Placed This Encounter  Procedures   CBC with Differential ([place] Only)    Standing Status:   Future    Number of Occurrences:   1    Standing Expiration Date:   02/27/2024   CMP ([place] only)     Standing Status:   Future    Number of Occurrences:   1    Standing Expiration Date:   02/27/2024   Ferritin    Standing Status:   Future    Number of Occurrences:   1    Standing Expiration Date:   02/27/2024   Reticulocytes    Standing Status:   Future    Number of Occurrences:   1    Standing Expiration Date:   02/27/2024   Vitamin B12    Standing Status:   Future    Number of Occurrences:   1    Standing Expiration Date:   02/27/2024   Folate    Standing Status:   Future    Number of Occurrences:   1    Standing Expiration Date:   02/27/2024   Save Smear for Provider Slide Review    Standing Status:   Future    Number of Occurrences:   1    Standing Expiration Date:   02/27/2024   CBC with Differential ([facility] Only)    Standing Status:   Future    Standing Expiration Date:   02/27/2024   CMP ([place] only)    Standing Status:   Future    Standing Expiration Date:   02/27/2024   Reticulocytes    Standing Status:   Future    Standing Expiration Date:   02/27/2024   Iron and TIBC    Standing Status:   Future    Standing Expiration Date:   02/27/2024   Ferritin    Standing Status:   Future    Standing Expiration Date:   02/27/2024    All questions were answered. The patient knows to call the clinic with any problems, questions or concerns.  I have spent a total of 30 minutes minutes of face-to-face and non-face-to-face time, preparing to see the patient, obtaining and/or reviewing separately obtained history, performing a medically appropriate examination, counseling and educating the patient, ordering medications/tests/procedures, referring and communicating with other health care professionals, documenting clinical information in the electronic health record, independently interpreting results and communicating results to the patient, and care coordination.    Nelwyn Salisbury PA-C Department of Hematology/Oncology [facility]

## 2023-02-28 ENCOUNTER — Ambulatory Visit (HOSPITAL_COMMUNITY): Payer: No Typology Code available for payment source | Attending: Family Medicine

## 2023-02-28 ENCOUNTER — Telehealth: Payer: Self-pay

## 2023-02-28 DIAGNOSIS — R55 Syncope and collapse: Secondary | ICD-10-CM | POA: Diagnosis not present

## 2023-02-28 DIAGNOSIS — I10 Essential (primary) hypertension: Secondary | ICD-10-CM | POA: Insufficient documentation

## 2023-02-28 DIAGNOSIS — R000 Tachycardia, unspecified: Secondary | ICD-10-CM | POA: Diagnosis not present

## 2023-02-28 LAB — IRON AND IRON BINDING CAPACITY (CC-WL,HP ONLY)
Iron: 19 ug/dL — ABNORMAL LOW (ref 28–170)
Saturation Ratios: 4 % — ABNORMAL LOW (ref 10.4–31.8)
TIBC: 543 ug/dL — ABNORMAL HIGH (ref 250–450)
UIBC: 524 ug/dL — ABNORMAL HIGH (ref 148–442)

## 2023-02-28 LAB — ECHOCARDIOGRAM COMPLETE
Area-P 1/2: 3.7 cm2
S' Lateral: 3.2 cm

## 2023-02-28 NOTE — Telephone Encounter (Signed)
Received phone call from patient stating she had an appointment with her GI MD this AM. It was recommended that she have an endoscopy and colonoscopy which is scheduled for 03/13/2023. Patient calling to find out if she should go to ER to get procedure done quicker or is it ok to wait for her appointment. Discussed with Nelwyn Salisbury PA who stated it was ok for patient to wait for appointment for procedure but would like her to be seen a week earlier for follow up. This RN discussed with patient who verbalized understanding. Message sent to scheduling to move patient appointment up and patient aware scheduling will call with new appointment date and time.

## 2023-03-01 ENCOUNTER — Encounter: Payer: Self-pay | Admitting: Family

## 2023-03-06 ENCOUNTER — Other Ambulatory Visit: Payer: Self-pay

## 2023-03-06 ENCOUNTER — Inpatient Hospital Stay: Payer: No Typology Code available for payment source

## 2023-03-06 ENCOUNTER — Inpatient Hospital Stay: Payer: No Typology Code available for payment source | Admitting: Medical Oncology

## 2023-03-06 VITALS — BP 123/82 | HR 70 | Temp 98.9°F | Resp 16

## 2023-03-06 DIAGNOSIS — D509 Iron deficiency anemia, unspecified: Secondary | ICD-10-CM

## 2023-03-06 DIAGNOSIS — D649 Anemia, unspecified: Secondary | ICD-10-CM

## 2023-03-06 LAB — CBC WITH DIFFERENTIAL (CANCER CENTER ONLY)
Abs Immature Granulocytes: 0.02 10*3/uL (ref 0.00–0.07)
Basophils Absolute: 0 10*3/uL (ref 0.0–0.1)
Basophils Relative: 1 %
Eosinophils Absolute: 0.1 10*3/uL (ref 0.0–0.5)
Eosinophils Relative: 2 %
HCT: 30.4 % — ABNORMAL LOW (ref 36.0–46.0)
Hemoglobin: 9.2 g/dL — ABNORMAL LOW (ref 12.0–15.0)
Immature Granulocytes: 0 %
Lymphocytes Relative: 27 %
Lymphs Abs: 1.4 10*3/uL (ref 0.7–4.0)
MCH: 24.5 pg — ABNORMAL LOW (ref 26.0–34.0)
MCHC: 30.3 g/dL (ref 30.0–36.0)
MCV: 80.9 fL (ref 80.0–100.0)
Monocytes Absolute: 0.4 10*3/uL (ref 0.1–1.0)
Monocytes Relative: 8 %
Neutro Abs: 3.2 10*3/uL (ref 1.7–7.7)
Neutrophils Relative %: 62 %
Platelet Count: 379 10*3/uL (ref 150–400)
RBC: 3.76 MIL/uL — ABNORMAL LOW (ref 3.87–5.11)
RDW: 14.1 % (ref 11.5–15.5)
WBC Count: 5.1 10*3/uL (ref 4.0–10.5)
nRBC: 0 % (ref 0.0–0.2)

## 2023-03-06 LAB — IRON AND IRON BINDING CAPACITY (CC-WL,HP ONLY)
Iron: 16 ug/dL — ABNORMAL LOW (ref 28–170)
Saturation Ratios: 3 % — ABNORMAL LOW (ref 10.4–31.8)
TIBC: 518 ug/dL — ABNORMAL HIGH (ref 250–450)
UIBC: 502 ug/dL — ABNORMAL HIGH (ref 148–442)

## 2023-03-06 LAB — CMP (CANCER CENTER ONLY)
ALT: 19 U/L (ref 0–44)
AST: 22 U/L (ref 15–41)
Albumin: 3.8 g/dL (ref 3.5–5.0)
Alkaline Phosphatase: 55 U/L (ref 38–126)
Anion gap: 8 (ref 5–15)
BUN: 9 mg/dL (ref 6–20)
CO2: 23 mmol/L (ref 22–32)
Calcium: 8.4 mg/dL — ABNORMAL LOW (ref 8.9–10.3)
Chloride: 105 mmol/L (ref 98–111)
Creatinine: 0.72 mg/dL (ref 0.44–1.00)
GFR, Estimated: >60 mL/min (ref >60)
Glucose, Bld: 111 mg/dL — ABNORMAL HIGH (ref 70–99)
Potassium: 3.6 mmol/L (ref 3.5–5.1)
Sodium: 136 mmol/L (ref 135–145)
Total Bilirubin: 0.1 mg/dL — ABNORMAL LOW (ref 0.3–1.2)
Total Protein: 6.9 g/dL (ref 6.5–8.1)

## 2023-03-06 LAB — RETICULOCYTES
Immature Retic Fract: 8.9 % (ref 2.3–15.9)
RBC.: 3.9 MIL/uL (ref 3.87–5.11)
Retic Count, Absolute: 57 10*3/uL (ref 19.0–186.0)
Retic Ct Pct: 1.5 % (ref 0.4–3.1)

## 2023-03-06 LAB — FERRITIN: Ferritin: 2 ng/mL — ABNORMAL LOW (ref 11–307)

## 2023-03-06 MED ORDER — SODIUM CHLORIDE 0.9 % IV SOLN
200.0000 mg | Freq: Once | INTRAVENOUS | Status: AC
  Administered 2023-03-06: 200 mg via INTRAVENOUS
  Filled 2023-03-06: qty 200

## 2023-03-06 MED ORDER — LORATADINE 10 MG PO TABS: 10.0000 mg | ORAL_TABLET | Freq: Every day | ORAL | Status: DC

## 2023-03-06 MED ORDER — SODIUM CHLORIDE 0.9 % IV SOLN: Freq: Once | INTRAVENOUS | Status: AC

## 2023-03-06 NOTE — Patient Instructions (Signed)

## 2023-03-08 ENCOUNTER — Inpatient Hospital Stay: Payer: No Typology Code available for payment source

## 2023-03-08 ENCOUNTER — Ambulatory Visit: Payer: No Typology Code available for payment source | Admitting: Medical Oncology

## 2023-03-12 ENCOUNTER — Inpatient Hospital Stay: Payer: No Typology Code available for payment source

## 2023-03-12 ENCOUNTER — Inpatient Hospital Stay (HOSPITAL_BASED_OUTPATIENT_CLINIC_OR_DEPARTMENT_OTHER): Payer: No Typology Code available for payment source | Admitting: Medical Oncology

## 2023-03-12 VITALS — BP 147/90 | HR 74 | Temp 98.0°F | Resp 17 | Ht 65.0 in | Wt 229.1 lb

## 2023-03-12 DIAGNOSIS — D509 Iron deficiency anemia, unspecified: Secondary | ICD-10-CM | POA: Diagnosis not present

## 2023-03-12 DIAGNOSIS — D649 Anemia, unspecified: Secondary | ICD-10-CM

## 2023-03-12 LAB — CBC
HCT: 32.2 % — ABNORMAL LOW (ref 36.0–46.0)
Hemoglobin: 9.6 g/dL — ABNORMAL LOW (ref 12.0–15.0)
MCH: 24.7 pg — ABNORMAL LOW (ref 26.0–34.0)
MCHC: 29.8 g/dL — ABNORMAL LOW (ref 30.0–36.0)
MCV: 83 fL (ref 80.0–100.0)
Platelets: 315 10*3/uL (ref 150–400)
RBC: 3.88 MIL/uL (ref 3.87–5.11)
RDW: 16.6 % — ABNORMAL HIGH (ref 11.5–15.5)
WBC: 5.7 10*3/uL (ref 4.0–10.5)
nRBC: 0 % (ref 0.0–0.2)

## 2023-03-12 NOTE — Progress Notes (Signed)
Hematology and Oncology Follow Up Visit  Heather Krause [id number] 1986-04-25 37 y.o. 03/12/2023  Past Medical History:  Diagnosis Date   Allergy    Latex   Anxiety    Asthma    Depression    GERD (gastroesophageal reflux disease)    Hypertension    Thyroid disease     Principle Diagnosis:  Anemia  Current Therapy:   IV Iron    Interim History:  Heather Krause is back for follow-up for her anemia.    She has a endoscopy/colonoscopy scheduled for tomorrow. She denies any bleeding/bruising episodes. Still fatigued. Still having some dizziness episodes for which she is being assessed by cardiology. No SOB, hematuria, hemoptysis.      Wt Readings from Last 3 Encounters:  03/12/23 229 lb 1.9 oz (103.9 kg)  02/27/23 227 lb (103 kg)  02/22/23 224 lb (101.6 kg)     Medications:   Current Outpatient Medications:    AIMOVIG 140 MG/ML SOAJ, Inject 1 mL into the skin every 30 (thirty) days., Disp: , Rfl:    ALPRAZolam (XANAX) 0.25 MG tablet, Take 0.25 mg by mouth 2 (two) times daily as needed., Disp: , Rfl:    cetirizine (ZYRTEC) 10 MG tablet, Take 10 mg by mouth daily., Disp: , Rfl:    dicyclomine (BENTYL) 10 MG capsule, Take 10 mg by mouth 4 (four) times daily -  before meals and at bedtime., Disp: , Rfl:    Erenumab-aooe 140 MG/ML SOAJ, Inject into the skin., Disp: , Rfl:    gabapentin (NEURONTIN) 100 MG capsule, Take by mouth., Disp: , Rfl:    Levothyroxine Sodium 200 MCG CAPS, Take 200 mcg by mouth daily before breakfast., Disp: , Rfl:    melatonin 5 MG TABS, Take by mouth., Disp: , Rfl:    MULTIPLE VITAMIN PO, Take by mouth., Disp: , Rfl:    norethindrone (AYGESTIN) 5 MG tablet, Take 1 tablet (5 mg total) by mouth daily., Disp: 90 tablet, Rfl: 1   ondansetron (ZOFRAN-ODT) 4 MG disintegrating tablet, Take 1 tablet (4 mg total) by mouth every 8 (eight) hours as needed for nausea or vomiting., Disp: 20 tablet, Rfl: 0   pantoprazole (PROTONIX) 40 MG tablet, Take 1 tablet by mouth  daily., Disp: , Rfl:    sertraline (ZOLOFT) 100 MG tablet, Take 1 tablet (100 mg total) by mouth daily., Disp: 90 tablet, Rfl: 1   sucralfate (CARAFATE) 1 g tablet, Take 1 tablet (1 g total) by mouth 4 (four) times daily -  with meals and at bedtime for 14 days., Disp: 56 tablet, Rfl: 0   timolol (TIMOPTIC) 0.5 % ophthalmic solution, 1 drop every morning., Disp: , Rfl:    UBRELVY 100 MG TABS, Take by mouth., Disp: , Rfl:    EPINEPHrine 0.3 mg/0.3 mL IJ SOAJ injection, EpiPen 2-Pak 0.3 mg/0.3 mL injection, auto-injector (Patient not taking: Reported on 03/12/2023), Disp: , Rfl:    furosemide (LASIX) 20 MG tablet, TAKE 1 TABLET(20 MG) BY MOUTH DAILY (Patient not taking: Reported on 03/12/2023), Disp: 30 tablet, Rfl: 0  Allergies:  Allergies  Allergen Reactions   Bee Venom Anaphylaxis   Fluarix Quadrivalent [Influenza Vac Split Quad] Other (See Comments)    Autoimmune response   Bisoprolol-Hydrochlorothiazide Swelling    Generalized swelling but not the face or throat   Diclofenac Sodium Swelling    Generalized swelling but not the face or throat.   Latex Rash    Past Medical History, Surgical history, Social history, and Family  History were reviewed and updated.  Review of Systems: Review of Systems  Constitutional:  Positive for fatigue.  HENT:   Negative for nosebleeds.   Respiratory:  Negative for shortness of breath.   Gastrointestinal:  Negative for blood in stool.  Genitourinary:  Negative for hematuria.      Physical Exam:  height is 5\' 5"  (1.651 m) and weight is 229 lb 1.9 oz (103.9 kg). Her oral temperature is 98 F (36.7 C). Her blood pressure is 147/90 (abnormal) and her pulse is 74. Her respiration is 17 and oxygen saturation is 100%.   Physical Exam Vitals and nursing note reviewed.  Constitutional:      General: She is not in acute distress.    Appearance: Normal appearance. She is obese. She is not ill-appearing, toxic-appearing or diaphoretic.  Cardiovascular:      Rate and Rhythm: Normal rate and regular rhythm.     Heart sounds: Normal heart sounds.  Pulmonary:     Effort: Pulmonary effort is normal.     Breath sounds: Normal breath sounds.  Skin:    General: Skin is warm.     Coloration: Skin is not pale.  Neurological:     General: No focal deficit present.     Mental Status: She is alert and oriented to person, place, and time.       Lab Results  Component Value Date   WBC 5.7 03/12/2023   HGB 9.6 (L) 03/12/2023   HCT 32.2 (L) 03/12/2023   MCV 83.0 03/12/2023   PLT 315 03/12/2023     Chemistry      Component Value Date/Time   NA 136 03/06/2023 1049   K 3.6 03/06/2023 1049   CL 105 03/06/2023 1049   CO2 23 03/06/2023 1049   BUN 9 03/06/2023 1049   CREATININE 0.72 03/06/2023 1049   CREATININE 0.70 02/22/2023 1041      Component Value Date/Time   CALCIUM 8.4 (L) 03/06/2023 1049   ALKPHOS 55 03/06/2023 1049   AST 22 03/06/2023 1049   ALT 19 03/06/2023 1049   BILITOT 0.1 (L) 03/06/2023 1049      Impression and Plan: 1. Anemia, unspecified type - CBC - CBC with Differential ([facility] Only); Future - Reticulocytes; Future - Iron and TIBC; Future - Ferritin; Future   Reviewed her CBC which shows slight improvement in Hgb today. This is a good sign. Keep endoscopy/colonoscopy appointment. Continue follow up with PCP/cardiology regarding the dizziness.   Heather Krause   Disposition: Continue IV iron Keep endoscopy/colonoscopy appointment that is scheduled for tomorrow RTC 1 month APP, labs    Nelwyn Salisbury PA-C 3/18/202410:15 AM

## 2023-03-13 ENCOUNTER — Inpatient Hospital Stay: Payer: No Typology Code available for payment source

## 2023-03-13 ENCOUNTER — Ambulatory Visit: Payer: No Typology Code available for payment source | Admitting: Family

## 2023-03-13 VITALS — BP 115/85 | HR 64 | Temp 98.3°F | Resp 17

## 2023-03-13 DIAGNOSIS — D509 Iron deficiency anemia, unspecified: Secondary | ICD-10-CM | POA: Diagnosis not present

## 2023-03-13 MED ORDER — LORATADINE 10 MG PO TABS
10.0000 mg | ORAL_TABLET | Freq: Every day | ORAL | Status: DC
  Filled 2023-03-13: qty 1

## 2023-03-13 MED ORDER — SODIUM CHLORIDE 0.9 % IV SOLN: Freq: Once | INTRAVENOUS | Status: AC

## 2023-03-13 MED ORDER — SODIUM CHLORIDE 0.9 % IV SOLN
200.0000 mg | Freq: Once | INTRAVENOUS | Status: AC
  Administered 2023-03-13: 200 mg via INTRAVENOUS
  Filled 2023-03-13: qty 200

## 2023-03-13 NOTE — Patient Instructions (Signed)

## 2023-03-13 NOTE — Progress Notes (Signed)
Patient kept the IV in to use it today at her colonoscopy appt.

## 2023-03-20 ENCOUNTER — Inpatient Hospital Stay: Payer: No Typology Code available for payment source

## 2023-03-20 VITALS — BP 134/81 | HR 60 | Temp 98.2°F | Resp 18

## 2023-03-20 DIAGNOSIS — D509 Iron deficiency anemia, unspecified: Secondary | ICD-10-CM

## 2023-03-20 MED ORDER — SODIUM CHLORIDE 0.9 % IV SOLN: Freq: Once | INTRAVENOUS | Status: AC

## 2023-03-20 MED ORDER — SODIUM CHLORIDE 0.9 % IV SOLN
200.0000 mg | Freq: Once | INTRAVENOUS | Status: AC
  Administered 2023-03-20: 200 mg via INTRAVENOUS
  Filled 2023-03-20: qty 200

## 2023-03-20 NOTE — Patient Instructions (Signed)

## 2023-03-26 ENCOUNTER — Inpatient Hospital Stay: Payer: No Typology Code available for payment source | Attending: Hematology & Oncology

## 2023-03-26 VITALS — BP 145/92 | HR 71 | Resp 18

## 2023-03-26 DIAGNOSIS — D509 Iron deficiency anemia, unspecified: Secondary | ICD-10-CM | POA: Diagnosis present

## 2023-03-26 DIAGNOSIS — R42 Dizziness and giddiness: Secondary | ICD-10-CM | POA: Insufficient documentation

## 2023-03-26 DIAGNOSIS — R5383 Other fatigue: Secondary | ICD-10-CM | POA: Diagnosis not present

## 2023-03-26 MED ORDER — SODIUM CHLORIDE 0.9 % IV SOLN: Freq: Once | INTRAVENOUS | Status: AC

## 2023-03-26 MED ORDER — SODIUM CHLORIDE 0.9 % IV SOLN
200.0000 mg | Freq: Once | INTRAVENOUS | Status: AC
  Administered 2023-03-26: 200 mg via INTRAVENOUS
  Filled 2023-03-26: qty 200

## 2023-03-26 NOTE — Patient Instructions (Signed)

## 2023-03-27 ENCOUNTER — Inpatient Hospital Stay: Payer: No Typology Code available for payment source

## 2023-04-03 ENCOUNTER — Inpatient Hospital Stay: Payer: No Typology Code available for payment source

## 2023-04-03 VITALS — BP 123/79 | HR 59 | Temp 97.7°F | Resp 20

## 2023-04-03 DIAGNOSIS — D509 Iron deficiency anemia, unspecified: Secondary | ICD-10-CM

## 2023-04-03 MED ORDER — SODIUM CHLORIDE 0.9 % IV SOLN
200.0000 mg | Freq: Once | INTRAVENOUS | Status: AC
  Administered 2023-04-03: 200 mg via INTRAVENOUS
  Filled 2023-04-03: qty 10

## 2023-04-03 MED ORDER — SODIUM CHLORIDE 0.9 % IV SOLN: Freq: Once | INTRAVENOUS | Status: AC

## 2023-04-03 MED ORDER — LORATADINE 10 MG PO TABS
10.0000 mg | ORAL_TABLET | Freq: Once | ORAL | Status: DC
  Filled 2023-04-03: qty 1

## 2023-04-03 NOTE — Progress Notes (Signed)
Patient does not want to stay for the 30 minute recommended IV iron observation. Patient VSS. Patient tolerated well without complaints or concerns.

## 2023-04-03 NOTE — Patient Instructions (Signed)

## 2023-04-12 ENCOUNTER — Inpatient Hospital Stay: Payer: No Typology Code available for payment source

## 2023-04-12 ENCOUNTER — Encounter: Payer: Self-pay | Admitting: Medical Oncology

## 2023-04-12 ENCOUNTER — Inpatient Hospital Stay (HOSPITAL_BASED_OUTPATIENT_CLINIC_OR_DEPARTMENT_OTHER): Payer: No Typology Code available for payment source | Admitting: Medical Oncology

## 2023-04-12 ENCOUNTER — Other Ambulatory Visit: Payer: Self-pay

## 2023-04-12 VITALS — BP 126/88 | HR 63 | Temp 98.3°F | Resp 18 | Ht 65.0 in | Wt 227.1 lb

## 2023-04-12 DIAGNOSIS — D649 Anemia, unspecified: Secondary | ICD-10-CM

## 2023-04-12 DIAGNOSIS — D509 Iron deficiency anemia, unspecified: Secondary | ICD-10-CM | POA: Diagnosis not present

## 2023-04-12 LAB — CBC WITH DIFFERENTIAL (CANCER CENTER ONLY)
Abs Immature Granulocytes: 0.01 10*3/uL (ref 0.00–0.07)
Basophils Absolute: 0.1 10*3/uL (ref 0.0–0.1)
Basophils Relative: 1 %
Eosinophils Absolute: 0.2 10*3/uL (ref 0.0–0.5)
Eosinophils Relative: 4 %
HCT: 39.1 % (ref 36.0–46.0)
Hemoglobin: 12.3 g/dL (ref 12.0–15.0)
Immature Granulocytes: 0 %
Lymphocytes Relative: 32 %
Lymphs Abs: 1.6 10*3/uL (ref 0.7–4.0)
MCH: 27.1 pg (ref 26.0–34.0)
MCHC: 31.5 g/dL (ref 30.0–36.0)
MCV: 86.1 fL (ref 80.0–100.0)
Monocytes Absolute: 0.3 10*3/uL (ref 0.1–1.0)
Monocytes Relative: 7 %
Neutro Abs: 2.7 10*3/uL (ref 1.7–7.7)
Neutrophils Relative %: 56 %
Platelet Count: 297 10*3/uL (ref 150–400)
RBC: 4.54 MIL/uL (ref 3.87–5.11)
RDW: 21.8 % — ABNORMAL HIGH (ref 11.5–15.5)
WBC Count: 4.8 10*3/uL (ref 4.0–10.5)
nRBC: 0 % (ref 0.0–0.2)

## 2023-04-12 LAB — RETICULOCYTES
Immature Retic Fract: 8.9 % (ref 2.3–15.9)
RBC.: 4.47 MIL/uL (ref 3.87–5.11)
Retic Count, Absolute: 61.2 10*3/uL (ref 19.0–186.0)
Retic Ct Pct: 1.4 % (ref 0.4–3.1)

## 2023-04-12 LAB — FERRITIN: Ferritin: 151 ng/mL (ref 11–307)

## 2023-04-12 LAB — IRON AND IRON BINDING CAPACITY (CC-WL,HP ONLY)
Iron: 104 ug/dL (ref 28–170)
Saturation Ratios: 27 % (ref 10.4–31.8)
TIBC: 382 ug/dL (ref 250–450)
UIBC: 278 ug/dL (ref 148–442)

## 2023-04-12 NOTE — Progress Notes (Signed)
Hematology and Oncology Follow Up Visit  Heather Krause [id number] 10-22-1986 37 y.o. 04/12/2023  Past Medical History:  Diagnosis Date   Allergy    Latex   Anxiety    Asthma    Depression    GERD (gastroesophageal reflux disease)    Hypertension    Thyroid disease     Principle Diagnosis:  Anemia  Current Therapy:   IV Iron    Interim History:  Heather Krause is back for follow-up for her anemia.   She reports that she is doing better than when we saw her last. She is still having some dizziness episodes but they are less severe and less frequent. She has had a capsule study to complete her GI work up since our last visit- this was normal. She also has had shoulder surgery. No SOB, hematuria, hemoptysis. Periods are still heavy.      Wt Readings from Last 3 Encounters:  04/12/23 227 lb 1.9 oz (103 kg)  03/12/23 229 lb 1.9 oz (103.9 kg)  02/27/23 227 lb (103 kg)     Medications:   Current Outpatient Medications:    AIMOVIG 140 MG/ML SOAJ, Inject 1 mL into the skin every 30 (thirty) days., Disp: , Rfl:    ALPRAZolam (XANAX) 0.25 MG tablet, Take 0.25 mg by mouth 2 (two) times daily as needed., Disp: , Rfl:    cetirizine (ZYRTEC) 10 MG tablet, Take 10 mg by mouth daily., Disp: , Rfl:    cyclobenzaprine (FLEXERIL) 5 MG tablet, Take 5 mg by mouth 3 (three) times daily as needed., Disp: , Rfl:    dicyclomine (BENTYL) 10 MG capsule, Take 10 mg by mouth 4 (four) times daily -  before meals and at bedtime., Disp: , Rfl:    gabapentin (NEURONTIN) 100 MG capsule, Take by mouth., Disp: , Rfl:    HYDROcodone-acetaminophen (NORCO/VICODIN) 5-325 MG tablet, Take 1 tablet by mouth every 8 (eight) hours as needed., Disp: , Rfl:    Levothyroxine Sodium 200 MCG CAPS, Take 200 mcg by mouth daily before breakfast., Disp: , Rfl:    melatonin 5 MG TABS, Take by mouth., Disp: , Rfl:    MULTIPLE VITAMIN PO, Take by mouth., Disp: , Rfl:    norethindrone (AYGESTIN) 5 MG tablet, Take 1 tablet (5 mg  total) by mouth daily., Disp: 90 tablet, Rfl: 1   sertraline (ZOLOFT) 100 MG tablet, Take 1 tablet (100 mg total) by mouth daily., Disp: 90 tablet, Rfl: 1   UBRELVY 100 MG TABS, Take by mouth., Disp: , Rfl:    EPINEPHrine 0.3 mg/0.3 mL IJ SOAJ injection, EpiPen 2-Pak 0.3 mg/0.3 mL injection, auto-injector (Patient not taking: Reported on 03/12/2023), Disp: , Rfl:    furosemide (LASIX) 20 MG tablet, TAKE 1 TABLET(20 MG) BY MOUTH DAILY (Patient not taking: Reported on 03/12/2023), Disp: 30 tablet, Rfl: 0   ondansetron (ZOFRAN-ODT) 4 MG disintegrating tablet, Take 1 tablet (4 mg total) by mouth every 8 (eight) hours as needed for nausea or vomiting. (Patient not taking: Reported on 04/12/2023), Disp: 20 tablet, Rfl: 0   pantoprazole (PROTONIX) 40 MG tablet, Take 1 tablet by mouth daily., Disp: , Rfl:    sucralfate (CARAFATE) 1 g tablet, Take 1 tablet (1 g total) by mouth 4 (four) times daily -  with meals and at bedtime for 14 days., Disp: 56 tablet, Rfl: 0  Allergies:  Allergies  Allergen Reactions   Amitriptyline Swelling and Hypertension    Generalized body swelling, but not throat, face or  lips. Could not wake up.   Bee Venom Anaphylaxis   Fluarix Quadrivalent [Influenza Vac Split Quad] Other (See Comments)    Autoimmune response   Bisoprolol-Hydrochlorothiazide Swelling    Generalized swelling but not the face or throat   Diclofenac Sodium Swelling    Generalized swelling but not the face or throat.   Latex Rash    Past Medical History, Surgical history, Social history, and Family History were reviewed and updated.  Review of Systems: Review of Systems  Constitutional:  Positive for fatigue.  HENT:   Negative for nosebleeds.   Respiratory:  Negative for shortness of breath.   Gastrointestinal:  Negative for blood in stool.  Genitourinary:  Negative for hematuria.      Physical Exam:  height is  (1.651 m) and weight is 227 lb 1.9 oz (103 kg). Her oral temperature is 98.3 F  (36.8 C). Her blood pressure is 126/88 and her pulse is 63. Her respiration is 18 and oxygen saturation is 100%.   Physical Exam Vitals and nursing note reviewed.  Constitutional:      General: She is not in acute distress.    Appearance: Normal appearance. She is obese. She is not ill-appearing, toxic-appearing or diaphoretic.  Cardiovascular:     Rate and Rhythm: Normal rate and regular rhythm.     Heart sounds: Normal heart sounds.  Pulmonary:     Effort: Pulmonary effort is normal.     Breath sounds: Normal breath sounds.  Skin:    General: Skin is warm.     Coloration: Skin is not pale.  Neurological:     General: No focal deficit present.     Mental Status: She is alert and oriented to person, place, and time.       Lab Results  Component Value Date   WBC 4.8 04/12/2023   HGB 12.3 04/12/2023   HCT 39.1 04/12/2023   MCV 86.1 04/12/2023   PLT 297 04/12/2023     Chemistry      Component Value Date/Time   NA 136 03/06/2023 1049   K 3.6 03/06/2023 1049   CL 105 03/06/2023 1049   CO2 23 03/06/2023 1049   BUN 9 03/06/2023 1049   CREATININE 0.72 03/06/2023 1049   CREATININE 0.70 02/22/2023 1041      Component Value Date/Time   CALCIUM 8.4 (L) 03/06/2023 1049   ALKPHOS 55 03/06/2023 1049   AST 22 03/06/2023 1049   ALT 19 03/06/2023 1049   BILITOT 0.1 (L) 03/06/2023 1049      Impression and Plan: 1. Iron deficiency anemia, unspecified iron deficiency anemia type   Her Hgb has significantly improved with her IV iron infusions. Iron studies pending today. We will continue to follow her every 3 months and give IV iron as indicated. Continue PCP management for dizziness and fatigue.   Heather Krause   Disposition: RTC 3 months APP, labs    Clent Jacks PA-C 4/18/20249:49 AM

## 2023-04-23 ENCOUNTER — Encounter: Payer: Self-pay | Admitting: Family Medicine

## 2023-04-23 MED ORDER — SEMAGLUTIDE-WEIGHT MANAGEMENT 0.5 MG/0.5ML ~~LOC~~ SOAJ: 0.5000 mg | SUBCUTANEOUS | 0 refills | Status: AC

## 2023-04-23 MED ORDER — SEMAGLUTIDE-WEIGHT MANAGEMENT 0.25 MG/0.5ML ~~LOC~~ SOAJ: 0.2500 mg | SUBCUTANEOUS | 0 refills | Status: AC

## 2023-04-27 ENCOUNTER — Telehealth: Payer: Self-pay

## 2023-04-27 NOTE — Telephone Encounter (Signed)
Patient called stating she had iron infusion about 3 weeks ago and is feeling bad again, states she was told to call to get labs rechecked if she started feeling bad before her 3 month follow up. Discussed with Clent Jacks, PA, she would like patient to come in next week for labs and follow up. Called patient back and LM, scheduling message sent.

## 2023-04-30 ENCOUNTER — Encounter: Payer: Self-pay | Admitting: Medical Oncology

## 2023-04-30 ENCOUNTER — Inpatient Hospital Stay (HOSPITAL_BASED_OUTPATIENT_CLINIC_OR_DEPARTMENT_OTHER): Payer: No Typology Code available for payment source | Admitting: Medical Oncology

## 2023-04-30 ENCOUNTER — Inpatient Hospital Stay: Payer: No Typology Code available for payment source | Attending: Hematology & Oncology

## 2023-04-30 VITALS — BP 137/97 | HR 65 | Temp 98.2°F | Resp 17 | Wt 234.0 lb

## 2023-04-30 DIAGNOSIS — D509 Iron deficiency anemia, unspecified: Secondary | ICD-10-CM | POA: Diagnosis not present

## 2023-04-30 LAB — CBC WITH DIFFERENTIAL (CANCER CENTER ONLY)
Abs Immature Granulocytes: 0.06 10*3/uL (ref 0.00–0.07)
Basophils Absolute: 0.1 10*3/uL (ref 0.0–0.1)
Basophils Relative: 1 %
Eosinophils Absolute: 0.2 10*3/uL (ref 0.0–0.5)
Eosinophils Relative: 3 %
HCT: 41.5 % (ref 36.0–46.0)
Hemoglobin: 13.3 g/dL (ref 12.0–15.0)
Immature Granulocytes: 1 %
Lymphocytes Relative: 31 %
Lymphs Abs: 2 10*3/uL (ref 0.7–4.0)
MCH: 28.2 pg (ref 26.0–34.0)
MCHC: 32 g/dL (ref 30.0–36.0)
MCV: 87.9 fL (ref 80.0–100.0)
Monocytes Absolute: 0.5 10*3/uL (ref 0.1–1.0)
Monocytes Relative: 9 %
Neutro Abs: 3.6 10*3/uL (ref 1.7–7.7)
Neutrophils Relative %: 55 %
Platelet Count: 280 10*3/uL (ref 150–400)
RBC: 4.72 MIL/uL (ref 3.87–5.11)
RDW: 22.2 % — ABNORMAL HIGH (ref 11.5–15.5)
WBC Count: 6.4 10*3/uL (ref 4.0–10.5)
nRBC: 0 % (ref 0.0–0.2)

## 2023-04-30 LAB — IRON AND IRON BINDING CAPACITY (CC-WL,HP ONLY)
Iron: 109 ug/dL (ref 28–170)
Saturation Ratios: 25 % (ref 10.4–31.8)
TIBC: 442 ug/dL (ref 250–450)
UIBC: 333 ug/dL (ref 148–442)

## 2023-04-30 LAB — RETICULOCYTES
Immature Retic Fract: 9.9 % (ref 2.3–15.9)
RBC.: 4.82 MIL/uL (ref 3.87–5.11)
Retic Count, Absolute: 80.5 10*3/uL (ref 19.0–186.0)
Retic Ct Pct: 1.7 % (ref 0.4–3.1)

## 2023-04-30 LAB — FERRITIN: Ferritin: 62 ng/mL (ref 11–307)

## 2023-04-30 NOTE — Progress Notes (Signed)
Hematology and Oncology Follow Up Visit  Heather Krause [id number] [birth year] 37 y.o. 04/30/2023  Past Medical History:  Diagnosis Date   Allergy    Latex   Anxiety    Asthma    Depression    GERD (gastroesophageal reflux disease)    Hypertension    Thyroid disease     Principle Diagnosis:  Anemia  Current Therapy:   IV Iron    Interim History:  Heather Krause is back for fan unscheduled follow-up for her anemia.   The last time we saw her after receiving IV iron she was feeling well. Now fatigue and dizziness have returned. She is concerned that her iron may be low again. She reports no known episodes of blood loss other than her normal heavy menstrual cycles.   She has had full GI work up. She has had cardiology work up. Has history of sleep apnea but was found to have had resolution with weight loss. She is followed by neurology and has had extensive imaging and work up.   No SOB, hematuria, hemoptysis.      Wt Readings from Last 3 Encounters:  04/30/23 234 lb (106.1 kg)  04/12/23 227 lb 1.9 oz (103 kg)  03/12/23 229 lb 1.9 oz (103.9 kg)     Medications:   Current Outpatient Medications:    AIMOVIG 140 MG/ML SOAJ, Inject 1 mL into the skin every 30 (thirty) days., Disp: , Rfl:    ALPRAZolam (XANAX) 0.25 MG tablet, Take 0.25 mg by mouth 2 (two) times daily as needed., Disp: , Rfl:    cetirizine (ZYRTEC) 10 MG tablet, Take 10 mg by mouth daily., Disp: , Rfl:    dicyclomine (BENTYL) 10 MG capsule, Take 10 mg by mouth 4 (four) times daily -  before meals and at bedtime., Disp: , Rfl:    EPINEPHrine 0.3 mg/0.3 mL IJ SOAJ injection, EpiPen 2-Pak 0.3 mg/0.3 mL injection, auto-injector (Patient not taking: Reported on 03/12/2023), Disp: , Rfl:    furosemide (LASIX) 20 MG tablet, TAKE 1 TABLET(20 MG) BY MOUTH DAILY (Patient not taking: Reported on 03/12/2023), Disp: 30 tablet, Rfl: 0   gabapentin (NEURONTIN) 100 MG capsule, Take by mouth., Disp: , Rfl:    Levothyroxine Sodium 200  MCG CAPS, Take 200 mcg by mouth daily before breakfast., Disp: , Rfl:    melatonin 5 MG TABS, Take by mouth., Disp: , Rfl:    MULTIPLE VITAMIN PO, Take by mouth., Disp: , Rfl:    norethindrone (AYGESTIN) 5 MG tablet, Take 1 tablet (5 mg total) by mouth daily., Disp: 90 tablet, Rfl: 1   ondansetron (ZOFRAN-ODT) 4 MG disintegrating tablet, Take 1 tablet (4 mg total) by mouth every 8 (eight) hours as needed for nausea or vomiting. (Patient not taking: Reported on 04/12/2023), Disp: 20 tablet, Rfl: 0   pantoprazole (PROTONIX) 40 MG tablet, Take 1 tablet by mouth daily., Disp: , Rfl:    Semaglutide-Weight Management 0.25 MG/0.5ML SOAJ, Inject 0.25 mg into the skin once a week for 28 days., Disp: 2 mL, Rfl: 0   Semaglutide-Weight Management 0.5 MG/0.5ML SOAJ, Inject 0.5 mg into the skin once a week for 28 days., Disp: 2 mL, Rfl: 0   sertraline (ZOLOFT) 100 MG tablet, Take 1 tablet (100 mg total) by mouth daily., Disp: 90 tablet, Rfl: 1   sucralfate (CARAFATE) 1 g tablet, Take 1 tablet (1 g total) by mouth 4 (four) times daily -  with meals and at bedtime for 14 days., Disp: 56 tablet,  Rfl: 0   UBRELVY 100 MG TABS, Take by mouth., Disp: , Rfl:   Allergies:  Allergies  Allergen Reactions   Amitriptyline Swelling and Hypertension    Generalized body swelling, but not throat, face or lips. Could not wake up.   Bee Venom Anaphylaxis   Fluarix Quadrivalent [Influenza Vac Split Quad] Other (See Comments)    Autoimmune response   Bisoprolol-Hydrochlorothiazide Swelling    Generalized swelling but not the face or throat   Diclofenac Sodium Swelling    Generalized swelling but not the face or throat.   Latex Rash    Past Medical History, Surgical history, Social history, and Family History were reviewed and updated.  Review of Systems: Review of Systems  Constitutional:  Positive for fatigue.  HENT:   Negative for nosebleeds.   Respiratory:  Negative for shortness of breath.   Gastrointestinal:   Negative for blood in stool.  Genitourinary:  Negative for hematuria.   Neurological:  Positive for dizziness.     Physical Exam:  weight is 234 lb (106.1 kg). Her oral temperature is 98.2 F (36.8 C). Her blood pressure is 137/97 (abnormal) and her pulse is 65. Her respiration is 17 and oxygen saturation is 100%.   Physical Exam Vitals and nursing note reviewed.  Constitutional:      General: She is not in acute distress.    Appearance: Normal appearance. She is obese. She is not ill-appearing, toxic-appearing or diaphoretic.  Cardiovascular:     Rate and Rhythm: Normal rate and regular rhythm.     Heart sounds: Normal heart sounds.  Pulmonary:     Effort: Pulmonary effort is normal.     Breath sounds: Normal breath sounds.  Skin:    General: Skin is warm.     Coloration: Skin is not pale.  Neurological:     General: No focal deficit present.     Mental Status: She is alert and oriented to person, place, and time.    Lab Results  Component Value Date   WBC 6.4 04/30/2023   HGB 13.3 04/30/2023   HCT 41.5 04/30/2023   MCV 87.9 04/30/2023   PLT 280 04/30/2023     Chemistry      Component Value Date/Time   NA 136 03/06/2023 1049   K 3.6 03/06/2023 1049   CL 105 03/06/2023 1049   CO2 23 03/06/2023 1049   BUN 9 03/06/2023 1049   CREATININE 0.72 03/06/2023 1049   CREATININE 0.70 02/22/2023 1041      Component Value Date/Time   CALCIUM 8.4 (L) 03/06/2023 1049   ALKPHOS 55 03/06/2023 1049   AST 22 03/06/2023 1049   ALT 19 03/06/2023 1049   BILITOT 0.1 (L) 03/06/2023 1049      Impression and Plan: 1. Iron deficiency anemia, unspecified iron deficiency anemia type   Her CBC and retic panel today suggest that she has enough iron however she does have a history of low iron in the setting of a normal hemoglobin. She is not symptomatic in office other than fatigue. If her iron is low we will get her in for iron infusion. If iron is normal I would suggest she have  further neurological evaluation- may benefit from IIH work up with MRV.   Heather Krause   Disposition: RTC 3 months APP, labs (CBC w/, CMP, iron, ferritin, retic)    Clent Jacks PA-C 5/6/20241:20 PM

## 2023-06-03 ENCOUNTER — Other Ambulatory Visit: Payer: Self-pay | Admitting: Family Medicine

## 2023-06-04 ENCOUNTER — Inpatient Hospital Stay (HOSPITAL_BASED_OUTPATIENT_CLINIC_OR_DEPARTMENT_OTHER): Payer: No Typology Code available for payment source | Attending: Hematology & Oncology | Admitting: Medical Oncology

## 2023-06-04 ENCOUNTER — Encounter: Payer: Self-pay | Admitting: Medical Oncology

## 2023-06-04 ENCOUNTER — Inpatient Hospital Stay: Payer: No Typology Code available for payment source | Attending: Hematology & Oncology

## 2023-06-04 VITALS — BP 136/98 | HR 80 | Temp 98.0°F | Resp 17 | Ht 65.0 in | Wt 240.0 lb

## 2023-06-04 DIAGNOSIS — D509 Iron deficiency anemia, unspecified: Secondary | ICD-10-CM | POA: Insufficient documentation

## 2023-06-04 DIAGNOSIS — R5383 Other fatigue: Secondary | ICD-10-CM | POA: Diagnosis not present

## 2023-06-04 DIAGNOSIS — R519 Headache, unspecified: Secondary | ICD-10-CM | POA: Insufficient documentation

## 2023-06-04 DIAGNOSIS — R42 Dizziness and giddiness: Secondary | ICD-10-CM | POA: Diagnosis not present

## 2023-06-04 LAB — CMP (CANCER CENTER ONLY)
ALT: 22 U/L (ref 0–44)
AST: 15 U/L (ref 15–41)
Albumin: 4.3 g/dL (ref 3.5–5.0)
Alkaline Phosphatase: 52 U/L (ref 38–126)
Anion gap: 7 (ref 5–15)
BUN: 8 mg/dL (ref 6–20)
CO2: 26 mmol/L (ref 22–32)
Calcium: 8.6 mg/dL — ABNORMAL LOW (ref 8.9–10.3)
Chloride: 106 mmol/L (ref 98–111)
Creatinine: 0.76 mg/dL (ref 0.44–1.00)
GFR, Estimated: >60 mL/min (ref >60)
Glucose, Bld: 91 mg/dL (ref 70–99)
Potassium: 4.1 mmol/L (ref 3.5–5.1)
Sodium: 139 mmol/L (ref 135–145)
Total Bilirubin: 0.3 mg/dL (ref 0.3–1.2)
Total Protein: 6.9 g/dL (ref 6.5–8.1)

## 2023-06-04 LAB — CBC WITH DIFFERENTIAL (CANCER CENTER ONLY)
Abs Immature Granulocytes: 0.03 10*3/uL (ref 0.00–0.07)
Basophils Absolute: 0.1 10*3/uL (ref 0.0–0.1)
Basophils Relative: 1 %
Eosinophils Absolute: 0.2 10*3/uL (ref 0.0–0.5)
Eosinophils Relative: 3 %
HCT: 40.7 % (ref 36.0–46.0)
Hemoglobin: 13.5 g/dL (ref 12.0–15.0)
Immature Granulocytes: 1 %
Lymphocytes Relative: 31 %
Lymphs Abs: 1.9 10*3/uL (ref 0.7–4.0)
MCH: 30.5 pg (ref 26.0–34.0)
MCHC: 33.2 g/dL (ref 30.0–36.0)
MCV: 92.1 fL (ref 80.0–100.0)
Monocytes Absolute: 0.5 10*3/uL (ref 0.1–1.0)
Monocytes Relative: 7 %
Neutro Abs: 3.5 10*3/uL (ref 1.7–7.7)
Neutrophils Relative %: 57 %
Platelet Count: 256 10*3/uL (ref 150–400)
RBC: 4.42 MIL/uL (ref 3.87–5.11)
RDW: 17.7 % — ABNORMAL HIGH (ref 11.5–15.5)
WBC Count: 6.1 10*3/uL (ref 4.0–10.5)
nRBC: 0 % (ref 0.0–0.2)

## 2023-06-04 LAB — IRON AND IRON BINDING CAPACITY (CC-WL,HP ONLY)
Iron: 73 ug/dL (ref 28–170)
Saturation Ratios: 19 % (ref 10.4–31.8)
TIBC: 395 ug/dL (ref 250–450)
UIBC: 322 ug/dL (ref 148–442)

## 2023-06-04 LAB — FERRITIN: Ferritin: 42 ng/mL (ref 11–307)

## 2023-06-04 LAB — RETICULOCYTES
Immature Retic Fract: 15.8 % (ref 2.3–15.9)
RBC.: 4.39 MIL/uL (ref 3.87–5.11)
Retic Count, Absolute: 94.4 10*3/uL (ref 19.0–186.0)
Retic Ct Pct: 2.2 % (ref 0.4–3.1)

## 2023-06-04 NOTE — Progress Notes (Signed)
Hematology and Oncology Follow Up Visit  Heather Krause [id number] 05/21/1986 36 y.o. 06/04/2023  Past Medical History:  Diagnosis Date   Allergy    Latex   Anxiety    Asthma    Depression    GERD (gastroesophageal reflux disease)    Hypertension    Thyroid disease     Principle Diagnosis:  Anemia  Current Therapy:   IV Iron    Interim History:  Heather Krause is back for a scheduled follow-up for her anemia.   She has had full GI work up without significant abnormal findings. She has had cardiology work up related to her symptoms without abnormality. Has history of sleep apnea but was found to have had resolution with weight loss. She is followed by neurology and has had extensive imaging and work up. Her last brain imaging was performed in 2021- MR brain and CT. 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   Her main symptoms are brain fog, occasional dizziness and fatigue. Had worsened when she was iron deficient; slightly improved with IV iron sessions and correction of her iron deficiency.   She is here today and reports that things have been stable. No worsening symptoms and she denies any bleeding episodes.  No SOB, hematuria, hemoptysis.      Wt Readings from Last 3 Encounters:  06/04/23 240 lb (108.9 kg)  04/30/23 234 lb (106.1 kg)  04/12/23 227 lb 1.9 oz (103 kg)     Medications:   Current Outpatient Medications:    AIMOVIG 140 MG/ML SOAJ, Inject 1 mL into the skin every 30 (thirty) days., Disp: , Rfl:    ALPRAZolam (XANAX) 0.25 MG tablet, Take 0.25 mg by mouth 2 (two) times daily as needed., Disp: , Rfl:    cetirizine (ZYRTEC) 10 MG tablet, Take 10 mg by mouth daily., Disp: , Rfl:    dicyclomine (BENTYL) 10 MG capsule, Take 10 mg by mouth 4 (four) times daily -  before meals and at bedtime., Disp: , Rfl:    gabapentin (NEURONTIN) 100 MG capsule, Take by mouth., Disp: , Rfl:    Levothyroxine Sodium 200 MCG CAPS, Take 200 mcg by mouth daily before breakfast.,  Disp: , Rfl:    melatonin 5 MG TABS, Take by mouth., Disp: , Rfl:    MULTIPLE VITAMIN PO, Take by mouth., Disp: , Rfl:    norethindrone (AYGESTIN) 5 MG tablet, Take 1 tablet (5 mg total) by mouth daily., Disp: 90 tablet, Rfl: 1   ondansetron (ZOFRAN-ODT) 4 MG disintegrating tablet, Take 1 tablet (4 mg total) by mouth every 8 (eight) hours as needed for nausea or vomiting., Disp: 20 tablet, Rfl: 0   sertraline (ZOLOFT) 100 MG tablet, Take 1 tablet (100 mg total) by mouth daily., Disp: 90 tablet, Rfl: 1   UBRELVY 100 MG TABS, Take by mouth., Disp: , Rfl:    EPINEPHrine 0.3 mg/0.3 mL IJ SOAJ injection, EpiPen 2-Pak 0.3 mg/0.3 mL injection, auto-injector (Patient not taking: Reported on 06/04/2023), Disp: , Rfl:    furosemide (LASIX) 20 MG tablet, TAKE 1 TABLET(20 MG) BY MOUTH DAILY (Patient not taking: Reported on 06/04/2023), Disp: 30 tablet, Rfl: 0   pantoprazole (PROTONIX) 40 MG tablet, Take 1 tablet by mouth daily., Disp: , Rfl:    sucralfate (CARAFATE) 1 g tablet, Take 1 tablet (1 g total) by mouth 4 (four) times daily -  with meals and at bedtime for 14 days., Disp: 56 tablet, Rfl: 0  Allergies:  Allergies  Allergen  Reactions   Amitriptyline Swelling and Hypertension    Generalized body swelling, but not throat, face or lips. Could not wake up.   Bee Venom Anaphylaxis   Fluarix Quadrivalent [Influenza Vac Split Quad] Other (See Comments)    Autoimmune response   Bisoprolol-Hydrochlorothiazide Swelling    Generalized swelling but not the face or throat   Diclofenac Sodium Swelling    Generalized swelling but not the face or throat.   Latex Rash    Past Medical History, Surgical history, Social history, and Family History were reviewed and updated.  Review of Systems: Review of Systems  Constitutional:  Positive for fatigue.  HENT:   Negative for nosebleeds.   Respiratory:  Negative for shortness of breath.   Gastrointestinal:  Negative for blood in stool.  Genitourinary:  Negative  for hematuria.   Neurological:  Positive for dizziness.     Physical Exam:  height is 5\' 5"  (1.651 m) and weight is 240 lb (108.9 kg). Her oral temperature is 98 F (36.7 C). Her blood pressure is 136/98 (abnormal) and her pulse is 80. Her respiration is 17 and oxygen saturation is 100%.   Physical Exam Vitals and nursing note reviewed.  Constitutional:      General: She is not in acute distress.    Appearance: Normal appearance. She is obese. She is not ill-appearing, toxic-appearing or diaphoretic.  Cardiovascular:     Rate and Rhythm: Normal rate and regular rhythm.     Heart sounds: Normal heart sounds.  Pulmonary:     Effort: Pulmonary effort is normal.     Breath sounds: Normal breath sounds.  Skin:    General: Skin is warm.     Coloration: Skin is not pale.  Neurological:     General: No focal deficit present.     Mental Status: She is alert and oriented to person, place, and time.    Lab Results  Component Value Date   WBC 6.1 06/04/2023   HGB 13.5 06/04/2023   HCT 40.7 06/04/2023   MCV 92.1 06/04/2023   PLT 256 06/04/2023     Chemistry      Component Value Date/Time   NA 136 03/06/2023 1049   K 3.6 03/06/2023 1049   CL 105 03/06/2023 1049   CO2 23 03/06/2023 1049   BUN 9 03/06/2023 1049   CREATININE 0.72 03/06/2023 1049   CREATININE 0.70 02/22/2023 1041      Component Value Date/Time   CALCIUM 8.4 (L) 03/06/2023 1049   ALKPHOS 55 03/06/2023 1049   AST 22 03/06/2023 1049   ALT 19 03/06/2023 1049   BILITOT 0.1 (L) 03/06/2023 1049      Impression and Plan: 1. Iron deficiency anemia, unspecified iron deficiency anemia type - CBC with Differential (Cancer Center Only); Future - CMP (Cancer Center only); Future - Ferritin; Future - Reticulocytes; Future  Last iron studies were normal which makes my concern for potential other cause for her symptoms higher. Given her headaches, pulsatile tinnitus, brain fog, dizziness, light sensitivity I think this is  likely neurological in nature. I think she would benefit from repeat imaging including an MRV. We discussed my concern for potential IIH; ironically she has contacts at [facility] at [facility] who specializes in this condition. I discussed all of this with patient and encouraged PCP/neurology follow up (sound like she may also be due for repeat thyroid studies as well) to obtain referral if supported.   Ms. Cardy   Disposition: RTC 3 months APP,  labs (CBC w/, CMP, iron, ferritin, retic)    Clent Jacks PA-C 6/10/20249:47 AM

## 2023-06-05 ENCOUNTER — Telehealth: Payer: Self-pay | Admitting: *Deleted

## 2023-06-05 NOTE — Telephone Encounter (Signed)
Sent medical records request to [facility]

## 2023-06-06 ENCOUNTER — Other Ambulatory Visit: Payer: Self-pay | Admitting: Family Medicine

## 2023-06-06 MED ORDER — LEVOTHYROXINE SODIUM 200 MCG PO CAPS: 200.0000 ug | ORAL_CAPSULE | Freq: Every day | ORAL | 1 refills | Status: DC

## 2023-06-06 MED ORDER — EPINEPHRINE 0.3 MG/0.3ML IJ SOAJ: 0.3000 mg | INTRAMUSCULAR | 0 refills | Status: DC | PRN

## 2023-06-28 DIAGNOSIS — M19012 Primary osteoarthritis, left shoulder: Secondary | ICD-10-CM | POA: Insufficient documentation

## 2023-07-02 ENCOUNTER — Telehealth: Payer: Self-pay | Admitting: *Deleted

## 2023-07-02 NOTE — Telephone Encounter (Signed)
Call received from patient requesting to have labs for her 07/12/23 appt with Fortino Sic PA drawn at her PCP's office on 07/04/23.  Orders faxed to [phone] at Dr. Everrett Coombe office for CBC, CMET, Iron/TIBC, Ferritin and Reticulocytes per order of S. Avalon PA. Pt aware and is appreciative of assistance.

## 2023-07-04 ENCOUNTER — Encounter: Payer: Self-pay | Admitting: Family Medicine

## 2023-07-04 ENCOUNTER — Ambulatory Visit (INDEPENDENT_AMBULATORY_CARE_PROVIDER_SITE_OTHER): Payer: No Typology Code available for payment source | Admitting: Family Medicine

## 2023-07-04 VITALS — BP 138/95 | HR 74 | Ht 65.0 in | Wt 241.0 lb

## 2023-07-04 DIAGNOSIS — E063 Autoimmune thyroiditis: Secondary | ICD-10-CM

## 2023-07-04 DIAGNOSIS — E038 Other specified hypothyroidism: Secondary | ICD-10-CM

## 2023-07-04 DIAGNOSIS — G43709 Chronic migraine without aura, not intractable, without status migrainosus: Secondary | ICD-10-CM

## 2023-07-04 DIAGNOSIS — R5383 Other fatigue: Secondary | ICD-10-CM

## 2023-07-04 DIAGNOSIS — D509 Iron deficiency anemia, unspecified: Secondary | ICD-10-CM | POA: Diagnosis not present

## 2023-07-04 NOTE — Progress Notes (Signed)
Heather Krause - 37 y.o. 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  She has been receiving iron infusions.  She is updated labs today for follow-up of this.  She has had continued fatigue.  She may be needing FMLA for her iron infusions  He also seeing endocrinology for hypothyroidism.  Despite changing her thyroid regimen her TSH has remained quite elevated.  Today WITH a new endocrinologist recently but has not had anything back regarding her recent lab work.  She is seeing neurosurgery for evaluation of possible idiopathic intracranial hypertension.  They are planning on doing a lumbar puncture to evaluate opening pressures.  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  ROS:  A comprehensive ROS was completed and negative except as noted per HPI   Allergies  Allergen Reactions   Amitriptyline Swelling and Hypertension    Generalized body swelling, but not throat, face or lips. Could not wake up.   Bee Venom Anaphylaxis   Fluarix Quadrivalent [Influenza Vac Split Quad] Other (See Comments)    Autoimmune response   Bisoprolol-Hydrochlorothiazide Swelling    Generalized swelling but not the face or throat   Diclofenac Sodium Swelling    Generalized swelling but not the face or throat.   Latex Rash    Past Medical History:  Diagnosis Date   Allergy    Latex   Anxiety    Asthma    Depression    GERD (gastroesophageal reflux disease)    Hypertension    Thyroid disease     Past Surgical History:  Procedure Laterality Date   CHOLECYSTECTOMY     ESOPHAGUS SURGERY      Social History   Socioeconomic History   Marital status: Married    Spouse name: Not on file   Number of children: Not on file   Years of education: Not on  file   Highest education level: Associate degree: occupational, Scientist, product/process development, or vocational program  Occupational History   Not on file  Tobacco Use   Smoking status: Never    Passive exposure: Never   Smokeless tobacco: Never  Vaping Use   Vaping Use: Never used  Substance and Sexual Activity   Alcohol use: Yes    Comment: Occasionally   Drug use: No   Sexual activity: Yes    Partners: Male    Birth control/protection: None  Other Topics Concern   Not on file  Social History Narrative   Not on file   Social Determinants of Health   Financial Resource Strain: Low Risk  (07/02/2023)   Overall Financial Resource Strain (CARDIA)    Difficulty of Paying Living Expenses: Not very hard  Food Insecurity: No Food Insecurity (07/02/2023)   Hunger Vital Sign    Worried About Running Out of Food in the Last Year: Never true    Ran Out of Food in the Last Year: Never true  Transportation Needs: No Transportation Needs (07/02/2023)   PRAPARE - Administrator, Civil Service (Medical): No    Lack of Transportation (Non-Medical): No  Physical Activity: Insufficiently Active (07/02/2023)   Exercise Vital Sign    Days of Exercise per Week: 4 days    Minutes of Exercise per Session: 20 min  Stress: No Stress Concern Present (07/02/2023)   [employer] of Occupational Health - Occupational Stress Questionnaire    Feeling of Stress : Only a little  Social Connections: Socially Integrated (07/02/2023)   Social Connection and Isolation Panel [NHANES]    Frequency of Communication with Friends and Family: More than three times a week    Frequency of Social Gatherings with Friends and Family: More than three times a week    Attends Religious Services: 1 to 4 times per year    Active Member of Clubs or Organizations: Yes    Attends Engineer, structural: More than 4 times per year    Marital Status: Married    Family History  Problem Relation Age of Onset   Hypertension Mother     Aneurysm Mother    Anxiety disorder Mother    Arthritis Mother    Depression Mother    Hypertension Father    Arthritis Father    Cancer Maternal Grandmother    Diabetes Maternal Grandmother    Kidney disease Maternal Grandmother    Vision loss Maternal Grandmother    Varicose Veins Maternal Grandmother    Asthma Maternal Grandfather    Heart disease Paternal Grandfather     Health Maintenance  Topic Date Due   PAP SMEAR-Modifier  Never done   Hepatitis C Screening  08/23/2023 (Originally 07/02/2006)   HIV Screening  08/23/2023 (Originally 07/03/2003)   COVID-19 Vaccine (3 - 2023-24 season) 03/09/2024 (Originally 08/25/2022)   INFLUENZA VACCINE  07/26/2023   DTaP/Tdap/Td (2 - Td or Tdap) 12/20/2032   HPV VACCINES  Aged Out     ----------------------------------------------------------------------------------------------------------------------------------------------------------------------------------------------------------------- Physical Exam BP (!) 138/95 (BP Location: Left Arm, Patient Position: Sitting, Cuff Size: Large)   Pulse 74   Ht 5\' 5"  (1.651 m)   Wt 241 lb (109.3 kg)   SpO2 100%   BMI 40.10 kg/m   Physical Exam Constitutional:      Appearance: Normal appearance.  HENT:     Head: Normocephalic and atraumatic.  Eyes:     General: No scleral icterus. Cardiovascular:     Rate and Rhythm: Normal rate and regular rhythm.  Pulmonary:     Effort: Pulmonary effort is normal.     Breath sounds: Normal breath sounds.  Musculoskeletal:     Cervical back: Neck supple.  Neurological:     Mental Status: She is alert.  Psychiatric:        Mood and Affect: Mood normal.        Behavior: Behavior normal.     ------------------------------------------------------------------------------------------------------------------------------------------------------------------------------------------------------------------- Assessment and Plan  Hypothyroidism due to  Hashimoto's thyroiditis She is seen endocrinology for management of her hypothyroidism.  She had recent establish with a new endocrinologist, however has not heard anything back regarding her most recent labs.   IDA (iron deficiency anemia) Repeat CBC,, iron panel and metabolic panel.  Will fax these over to her hematologist  Migraines Possible intracranial hypertension.  She is seeing neurosurgery and planning on having lumbar puncture.   No orders of the defined types were placed in this encounter.   No follow-ups on file.    This visit occurred during the SARS-CoV-2 public health emergency.  Safety protocols were in place, including screening questions prior to the visit, additional usage of staff PPE, and extensive cleaning of exam room while observing appropriate contact time as indicated for disinfecting solutions.

## 2023-07-04 NOTE — Assessment & Plan Note (Signed)
She is seen endocrinology for management of her hypothyroidism.  She had recent establish with a new endocrinologist, however has not heard anything back regarding her most recent labs.

## 2023-07-04 NOTE — Assessment & Plan Note (Signed)
Repeat CBC,, iron panel and metabolic panel.  Will fax these over to her hematologist

## 2023-07-04 NOTE — Assessment & Plan Note (Signed)
Possible intracranial hypertension.  She is seeing neurosurgery and planning on having lumbar puncture.

## 2023-07-05 LAB — COMPLETE METABOLIC PANEL WITH GFR
AG Ratio: 1.8 (calc) (ref 1.0–2.5)
ALT: 27 U/L (ref 6–29)
AST: 15 U/L (ref 10–30)
Albumin: 4.8 g/dL (ref 3.6–5.1)
Alkaline phosphatase (APISO): 57 U/L (ref 31–125)
BUN: 10 mg/dL (ref 7–25)
CO2: 25 mmol/L (ref 20–32)
Calcium: 9.3 mg/dL (ref 8.6–10.2)
Chloride: 104 mmol/L (ref 98–110)
Creat: 0.82 mg/dL (ref 0.50–0.97)
Globulin: 2.6 g/dL (calc) (ref 1.9–3.7)
Glucose, Bld: 75 mg/dL (ref 65–99)
Potassium: 4.1 mmol/L (ref 3.5–5.3)
Sodium: 139 mmol/L (ref 135–146)
Total Bilirubin: 0.4 mg/dL (ref 0.2–1.2)
Total Protein: 7.4 g/dL (ref 6.1–8.1)
eGFR: 96 mL/min/{1.73_m2} (ref >=60)

## 2023-07-05 LAB — CBC WITH DIFFERENTIAL/PLATELET
Absolute Monocytes: 660 cells/uL (ref 200–950)
Basophils Absolute: 68 cells/uL (ref 0–200)
Basophils Relative: 1 %
Eosinophils Absolute: 143 cells/uL (ref 15–500)
Eosinophils Relative: 2.1 %
HCT: 41.6 % (ref 35.0–45.0)
Hemoglobin: 14.4 g/dL (ref 11.7–15.5)
Lymphs Abs: 2312 cells/uL (ref 850–3900)
MCH: 32.4 pg (ref 27.0–33.0)
MCHC: 34.6 g/dL (ref 32.0–36.0)
MCV: 93.7 fL (ref 80.0–100.0)
MPV: 8.9 fL (ref 7.5–12.5)
Monocytes Relative: 9.7 %
Neutro Abs: 3618 cells/uL (ref 1500–7800)
Neutrophils Relative %: 53.2 %
Platelets: 315 10*3/uL (ref 140–400)
RBC: 4.44 10*6/uL (ref 3.80–5.10)
RDW: 13.6 % (ref 11.0–15.0)
Total Lymphocyte: 34 %
WBC: 6.8 10*3/uL (ref 3.8–10.8)

## 2023-07-05 LAB — IRON,TIBC AND FERRITIN PANEL
%SAT: 23 % (calc) (ref 16–45)
Ferritin: 24 ng/mL (ref 16–154)
Iron: 97 ug/dL (ref 40–190)
TIBC: 413 mcg/dL (calc) (ref 250–450)

## 2023-07-05 LAB — VITAMIN B12: Vitamin B-12: 449 pg/mL (ref 200–1100)

## 2023-07-05 LAB — VITAMIN D 25 HYDROXY (VIT D DEFICIENCY, FRACTURES): Vit D, 25-Hydroxy: 32 ng/mL (ref 30–100)

## 2023-07-06 DIAGNOSIS — G932 Benign intracranial hypertension: Secondary | ICD-10-CM | POA: Insufficient documentation

## 2023-07-12 ENCOUNTER — Inpatient Hospital Stay: Payer: No Typology Code available for payment source | Attending: Hematology & Oncology

## 2023-07-12 ENCOUNTER — Other Ambulatory Visit: Payer: Self-pay

## 2023-07-12 ENCOUNTER — Encounter: Payer: Self-pay | Admitting: Medical Oncology

## 2023-07-12 ENCOUNTER — Inpatient Hospital Stay (HOSPITAL_BASED_OUTPATIENT_CLINIC_OR_DEPARTMENT_OTHER): Payer: No Typology Code available for payment source | Admitting: Medical Oncology

## 2023-07-12 VITALS — BP 128/99 | HR 73 | Temp 98.2°F | Resp 18 | Ht 65.0 in | Wt 237.1 lb

## 2023-07-12 DIAGNOSIS — D509 Iron deficiency anemia, unspecified: Secondary | ICD-10-CM

## 2023-07-12 LAB — IRON AND IRON BINDING CAPACITY (CC-WL,HP ONLY)
Iron: 71 ug/dL (ref 28–170)
Saturation Ratios: 16 % (ref 10.4–31.8)
TIBC: 440 ug/dL (ref 250–450)
UIBC: 369 ug/dL (ref 148–442)

## 2023-07-12 LAB — CMP (CANCER CENTER ONLY)
ALT: 25 U/L (ref 0–44)
AST: 14 U/L — ABNORMAL LOW (ref 15–41)
Albumin: 4.9 g/dL (ref 3.5–5.0)
Alkaline Phosphatase: 65 U/L (ref 38–126)
Anion gap: 8 (ref 5–15)
BUN: 11 mg/dL (ref 6–20)
CO2: 28 mmol/L (ref 22–32)
Calcium: 9.8 mg/dL (ref 8.9–10.3)
Chloride: 103 mmol/L (ref 98–111)
Creatinine: 0.74 mg/dL (ref 0.44–1.00)
GFR, Estimated: >60 mL/min (ref >60)
Glucose, Bld: 99 mg/dL (ref 70–99)
Potassium: 4.5 mmol/L (ref 3.5–5.1)
Sodium: 139 mmol/L (ref 135–145)
Total Bilirubin: 0.4 mg/dL (ref 0.3–1.2)
Total Protein: 7.8 g/dL (ref 6.5–8.1)

## 2023-07-12 LAB — RETICULOCYTES
Immature Retic Fract: 12.6 % (ref 2.3–15.9)
RBC.: 4.37 MIL/uL (ref 3.87–5.11)
Retic Count, Absolute: 111.9 10*3/uL (ref 19.0–186.0)
Retic Ct Pct: 2.6 % (ref 0.4–3.1)

## 2023-07-12 LAB — CBC WITH DIFFERENTIAL (CANCER CENTER ONLY)
Abs Immature Granulocytes: 0.07 10*3/uL (ref 0.00–0.07)
Basophils Absolute: 0.1 10*3/uL (ref 0.0–0.1)
Basophils Relative: 1 %
Eosinophils Absolute: 0 10*3/uL (ref 0.0–0.5)
Eosinophils Relative: 0 %
HCT: 42.5 % (ref 36.0–46.0)
Hemoglobin: 14.6 g/dL (ref 12.0–15.0)
Immature Granulocytes: 1 %
Lymphocytes Relative: 22 %
Lymphs Abs: 1.8 10*3/uL (ref 0.7–4.0)
MCH: 33.1 pg (ref 26.0–34.0)
MCHC: 34.4 g/dL (ref 30.0–36.0)
MCV: 96.4 fL (ref 80.0–100.0)
Monocytes Absolute: 0.6 10*3/uL (ref 0.1–1.0)
Monocytes Relative: 7 %
Neutro Abs: 5.7 10*3/uL (ref 1.7–7.7)
Neutrophils Relative %: 69 %
Platelet Count: 327 10*3/uL (ref 150–400)
RBC: 4.41 MIL/uL (ref 3.87–5.11)
RDW: 13.8 % (ref 11.5–15.5)
WBC Count: 8.3 10*3/uL (ref 4.0–10.5)
nRBC: 0 % (ref 0.0–0.2)

## 2023-07-12 LAB — FERRITIN: Ferritin: 40 ng/mL (ref 11–307)

## 2023-07-12 NOTE — Progress Notes (Signed)
Hematology and Oncology Follow Up Visit  Heather Krause [id number] 09-27-1986 37 y.o. 07/12/2023  Past Medical History:  Diagnosis Date   Allergy    Latex   Anxiety    Asthma    Depression    GERD (gastroesophageal reflux disease)    Hypertension    Thyroid disease     Principle Diagnosis:  Anemia  Current Therapy:   IV Iron    Interim History:  Ms. Heather Krause is back for a scheduled follow-up for her anemia.   She has had full GI work up without significant abnormal findings. She has had cardiology work up related to her symptoms without abnormality. Has history of sleep apnea but was found to have had resolution with weight loss. She is followed by neurology and now neurosurgery. Since her last visit with our office she was diagnosed with IIH and bilateral jugular stenosis. She has further work up on August 25th at [facility] for this.    In terms of her IDA she denies any new bleeding or bruising episodes.  She is tolerating IV iron well when needed.   No SOB, hematuria, hemoptysis, epistaxis, melena. Continues to have fatigue.      Wt Readings from Last 3 Encounters:  07/12/23 237 lb 1.9 oz (107.6 kg)  07/04/23 241 lb (109.3 kg)  06/04/23 240 lb (108.9 kg)     Medications:   Current Outpatient Medications:    AIMOVIG 140 MG/ML SOAJ, Inject 1 mL into the skin every 30 (thirty) days., Disp: , Rfl:    ALPRAZolam (XANAX) 0.25 MG tablet, Take 0.25 mg by mouth 2 (two) times daily as needed., Disp: , Rfl:    cetirizine (ZYRTEC) 10 MG tablet, Take 10 mg by mouth daily., Disp: , Rfl:    dicyclomine (BENTYL) 10 MG capsule, Take 10 mg by mouth 4 (four) times daily -  before meals and at bedtime., Disp: , Rfl:    furosemide (LASIX) 20 MG tablet, TAKE 1 TABLET(20 MG) BY MOUTH DAILY, Disp: 30 tablet, Rfl: 0   gabapentin (NEURONTIN) 100 MG capsule, Take by mouth., Disp: , Rfl:    levothyroxine (SYNTHROID) 75 MCG tablet, Take 75 mcg by mouth daily before breakfast., Disp: , Rfl:     Levothyroxine Sodium 200 MCG CAPS, Take 200 mcg by mouth daily before breakfast., Disp: 90 capsule, Rfl: 1   melatonin 5 MG TABS, Take by mouth., Disp: , Rfl:    MULTIPLE VITAMIN PO, Take by mouth., Disp: , Rfl:    norethindrone (AYGESTIN) 5 MG tablet, TAKE ONE TABLET BY MOUTH ONE TIME DAILY, Disp: 90 tablet, Rfl: 0   ondansetron (ZOFRAN-ODT) 4 MG disintegrating tablet, Take 1 tablet (4 mg total) by mouth every 8 (eight) hours as needed for nausea or vomiting., Disp: 20 tablet, Rfl: 0   pantoprazole (PROTONIX) 40 MG tablet, Take 1 tablet by mouth daily., Disp: , Rfl:    sertraline (ZOLOFT) 100 MG tablet, Take 1 tablet (100 mg total) by mouth daily., Disp: 90 tablet, Rfl: 1   UBRELVY 100 MG TABS, Take by mouth., Disp: , Rfl:    EPINEPHrine 0.3 mg/0.3 mL IJ SOAJ injection, Inject 0.3 mg into the muscle as needed for anaphylaxis. (Patient not taking: Reported on 07/12/2023), Disp: 1 each, Rfl: 0  Allergies:  Allergies  Allergen Reactions   Amitriptyline Swelling and Hypertension    Generalized body swelling, but not throat, face or lips. Could not wake up.   Bee Venom Anaphylaxis   Fluarix Quadrivalent [Influenza Vac Split  Quad] Other (See Comments)    Autoimmune response   Ivp Dye [Iodinated Contrast Media] Other (See Comments)    Severe migraine. Patient stated,"this was at [facility]. I now have to be pre-medicated before any Imaging studies."   Bisoprolol-Hydrochlorothiazide Swelling    Generalized swelling but not the face or throat   Diclofenac Sodium Swelling    Generalized swelling but not the face or throat.   Latex Rash    Past Medical History, Surgical history, Social history, and Family History were reviewed and updated.  Review of Systems: Review of Systems  Constitutional:  Positive for fatigue.  HENT:   Negative for nosebleeds.   Respiratory:  Negative for shortness of breath.   Gastrointestinal:  Negative for blood in stool.  Genitourinary:  Negative for hematuria.    Neurological:  Positive for dizziness.     Physical Exam:  height is 5\' 5"  (1.651 m) and weight is 237 lb 1.9 oz (107.6 kg). Her oral temperature is 98.2 F (36.8 C). Her blood pressure is 128/99 (abnormal) and her pulse is 73. Her respiration is 18 and oxygen saturation is 100%.   Physical Exam Vitals and nursing note reviewed.  Constitutional:      General: She is not in acute distress.    Appearance: Normal appearance. She is obese. She is not ill-appearing, toxic-appearing or diaphoretic.  Cardiovascular:     Rate and Rhythm: Normal rate and regular rhythm.     Heart sounds: Normal heart sounds.  Pulmonary:     Effort: Pulmonary effort is normal.     Breath sounds: Normal breath sounds.  Skin:    General: Skin is warm.     Coloration: Skin is not pale.  Neurological:     General: No focal deficit present.     Mental Status: She is alert and oriented to person, place, and time.    Lab Results  Component Value Date   WBC 8.3 07/12/2023   HGB 14.6 07/12/2023   HCT 42.5 07/12/2023   MCV 96.4 07/12/2023   PLT 327 07/12/2023     Chemistry      Component Value Date/Time   NA 139 07/04/2023 1428   K 4.1 07/04/2023 1428   CL 104 07/04/2023 1428   CO2 25 07/04/2023 1428   BUN 10 07/04/2023 1428   CREATININE 0.82 07/04/2023 1428      Component Value Date/Time   CALCIUM 9.3 07/04/2023 1428   ALKPHOS 52 06/04/2023 0916   AST 15 07/04/2023 1428   AST 15 06/04/2023 0916   ALT 27 07/04/2023 1428   ALT 22 06/04/2023 0916   BILITOT 0.4 07/04/2023 1428   BILITOT 0.3 06/04/2023 0916      Impression and Plan: 1. Iron deficiency anemia, unspecified iron deficiency anemia type - CBC with Differential (Cancer Center Only); Future - Iron and TIBC; Future - Ferritin; Future - Reticulocytes; Future   Very happy to hear that she is getting some answers to her medical concerns. The [facility] IIH team is really well known and I expect her to have positive outcomes. In terms of  her IDA her iron studies are pending. We will supplement PRN. Per her specialist recommendation she will be monitored monthly with labs relating to her iron.   [name]   Disposition: RTC 1 months APP, labs (CBC w/, CMP, iron, ferritin, retic) -[place]   Clent Jacks PA-C 7/18/20249:29 AM

## 2023-07-13 ENCOUNTER — Other Ambulatory Visit: Payer: Self-pay | Admitting: Medical Oncology

## 2023-07-13 ENCOUNTER — Telehealth: Payer: Self-pay

## 2023-07-13 NOTE — Telephone Encounter (Signed)
Received call from patient regarding her iron infusion next week. Pt states she has never had a reaction but did react to the MRI dye she had recently and was concerned if she needed to take any premeds prior to her infusion. This RN discussed with Clent Jacks, PA and pt to take a Claritin the night before coming in for her iron infusion. Pt stated she that she already takes zytrec nightly. Pt advised that she can just take zytrec only and receive her iron as scheduled. Pt verbalized understanding and had no further questions.

## 2023-07-16 ENCOUNTER — Encounter: Payer: Self-pay | Admitting: Family Medicine

## 2023-07-16 DIAGNOSIS — R2230 Localized swelling, mass and lump, unspecified upper limb: Secondary | ICD-10-CM

## 2023-07-17 ENCOUNTER — Other Ambulatory Visit: Payer: Self-pay | Admitting: Family Medicine

## 2023-07-17 DIAGNOSIS — R2230 Localized swelling, mass and lump, unspecified upper limb: Secondary | ICD-10-CM

## 2023-07-17 NOTE — Telephone Encounter (Signed)
Orders entered. She can schedule directly with imaging or they will contact her.   CM

## 2023-07-19 ENCOUNTER — Inpatient Hospital Stay: Payer: No Typology Code available for payment source

## 2023-07-19 VITALS — BP 170/96 | HR 79 | Temp 98.3°F | Resp 18

## 2023-07-19 DIAGNOSIS — D509 Iron deficiency anemia, unspecified: Secondary | ICD-10-CM | POA: Diagnosis not present

## 2023-07-19 MED ORDER — SODIUM CHLORIDE 0.9 % IV SOLN
300.0000 mg | Freq: Once | INTRAVENOUS | Status: AC
  Administered 2023-07-19: 300 mg via INTRAVENOUS
  Filled 2023-07-19: qty 300

## 2023-07-19 MED ORDER — SODIUM CHLORIDE 0.9 % IV SOLN: Freq: Once | INTRAVENOUS | Status: AC

## 2023-07-19 NOTE — Patient Instructions (Signed)
Iron Sucrose Injection What is this medication? IRON SUCROSE (EYE ern SOO krose) treats low levels of iron (iron deficiency anemia) in people with kidney disease. Iron is a mineral that plays an important role in making red blood cells, which carry oxygen from your lungs to the rest of your body. This medicine may be used for other purposes; ask your health care provider or pharmacist if you have questions. COMMON BRAND NAME(S): Venofer What should I tell my care team before I take this medication? They need to know if you have any of these conditions: Anemia not caused by low iron levels Heart disease High levels of iron in the blood Kidney disease Liver disease An unusual or allergic reaction to iron, other medications, foods, dyes, or preservatives Pregnant or trying to get pregnant Breastfeeding How should I use this medication? This medication is for infusion into a vein. It is given in a hospital or clinic setting. Talk to your care team about the use of this medication in children. While this medication may be prescribed for children as young as 2 years for selected conditions, precautions do apply. Overdosage: If you think you have taken too much of this medicine contact a poison control center or emergency room at once. NOTE: This medicine is only for you. Do not share this medicine with others. What if I miss a dose? Keep appointments for follow-up doses. It is important not to miss your dose. Call your care team if you are unable to keep an appointment. What may interact with this medication? Do not take this medication with any of the following: Deferoxamine Dimercaprol Other iron products This medication may also interact with the following: Chloramphenicol Deferasirox This list may not describe all possible interactions. Give your health care provider a list of all the medicines, herbs, non-prescription drugs, or dietary supplements you use. Also tell them if you smoke,  drink alcohol, or use illegal drugs. Some items may interact with your medicine. What should I watch for while using this medication? Visit your care team regularly. Tell your care team if your symptoms do not start to get better or if they get worse. You may need blood work done while you are taking this medication. You may need to follow a special diet. Talk to your care team. Foods that contain iron include: whole grains/cereals, dried fruits, beans, or peas, leafy green vegetables, and organ meats (liver, kidney). What side effects may I notice from receiving this medication? Side effects that you should report to your care team as soon as possible: Allergic reactions--skin rash, itching, hives, swelling of the face, lips, tongue, or throat Low blood pressure--dizziness, feeling faint or lightheaded, blurry vision Shortness of breath Side effects that usually do not require medical attention (report to your care team if they continue or are bothersome): Flushing Headache Joint pain Muscle pain Nausea Pain, redness, or irritation at injection site This list may not describe all possible side effects. Call your doctor for medical advice about side effects. You may report side effects to FDA at 1-800-FDA-1088. Where should I keep my medication? This medication is given in a hospital or clinic. It will not be stored at home. NOTE: This sheet is a summary. It may not cover all possible information. If you have questions about this medicine, talk to your doctor, pharmacist, or health care provider.  2024 Elsevier/Gold Standard (2023-05-18 00:00:00)

## 2023-07-24 ENCOUNTER — Encounter: Payer: Self-pay | Admitting: Family Medicine

## 2023-07-27 ENCOUNTER — Encounter: Payer: Self-pay | Admitting: Family Medicine

## 2023-07-27 NOTE — Telephone Encounter (Signed)
Forms completed and placed in Heather Krause's box

## 2023-07-31 ENCOUNTER — Encounter: Payer: Self-pay | Admitting: Family Medicine

## 2023-08-01 ENCOUNTER — Ambulatory Visit
Admission: RE | Admit: 2023-08-01 | Discharge: 2023-08-01 | Disposition: A | Discharge status: Home / Self Care | Payer: No Typology Code available for payment source | Source: Ambulatory Visit | Attending: Family Medicine | Admitting: Family Medicine

## 2023-08-01 DIAGNOSIS — R2230 Localized swelling, mass and lump, unspecified upper limb: Secondary | ICD-10-CM

## 2023-08-02 NOTE — Telephone Encounter (Signed)
Forms updated per request from [employer] [facility] [employer] company.  Placed in Heather Krause's box to be faxed back.   CM

## 2023-08-03 ENCOUNTER — Other Ambulatory Visit: Payer: Self-pay | Admitting: Family Medicine

## 2023-08-07 ENCOUNTER — Other Ambulatory Visit: Payer: Self-pay

## 2023-08-07 ENCOUNTER — Inpatient Hospital Stay (HOSPITAL_BASED_OUTPATIENT_CLINIC_OR_DEPARTMENT_OTHER): Payer: No Typology Code available for payment source | Admitting: Medical Oncology

## 2023-08-07 ENCOUNTER — Inpatient Hospital Stay: Payer: No Typology Code available for payment source | Attending: Hematology & Oncology

## 2023-08-07 ENCOUNTER — Encounter: Payer: Self-pay | Admitting: Medical Oncology

## 2023-08-07 VITALS — BP 122/91 | HR 74 | Temp 98.2°F | Resp 16 | Ht 65.0 in | Wt 241.0 lb

## 2023-08-07 DIAGNOSIS — D509 Iron deficiency anemia, unspecified: Secondary | ICD-10-CM | POA: Insufficient documentation

## 2023-08-07 DIAGNOSIS — R5383 Other fatigue: Secondary | ICD-10-CM | POA: Diagnosis not present

## 2023-08-07 LAB — CBC WITH DIFFERENTIAL (CANCER CENTER ONLY)
Abs Immature Granulocytes: 0.08 10*3/uL — ABNORMAL HIGH (ref 0.00–0.07)
Basophils Absolute: 0.1 10*3/uL (ref 0.0–0.1)
Basophils Relative: 1 %
Eosinophils Absolute: 0.1 10*3/uL (ref 0.0–0.5)
Eosinophils Relative: 2 %
HCT: 38.9 % (ref 36.0–46.0)
Hemoglobin: 13.2 g/dL (ref 12.0–15.0)
Immature Granulocytes: 1 %
Lymphocytes Relative: 29 %
Lymphs Abs: 1.9 10*3/uL (ref 0.7–4.0)
MCH: 33.1 pg (ref 26.0–34.0)
MCHC: 33.9 g/dL (ref 30.0–36.0)
MCV: 97.5 fL (ref 80.0–100.0)
Monocytes Absolute: 0.5 10*3/uL (ref 0.1–1.0)
Monocytes Relative: 8 %
Neutro Abs: 3.8 10*3/uL (ref 1.7–7.7)
Neutrophils Relative %: 59 %
Platelet Count: 291 10*3/uL (ref 150–400)
RBC: 3.99 MIL/uL (ref 3.87–5.11)
RDW: 13.2 % (ref 11.5–15.5)
WBC Count: 6.5 10*3/uL (ref 4.0–10.5)
nRBC: 0 % (ref 0.0–0.2)

## 2023-08-07 LAB — IRON AND IRON BINDING CAPACITY (CC-WL,HP ONLY)
Iron: 74 ug/dL (ref 28–170)
Saturation Ratios: 21 % (ref 10.4–31.8)
TIBC: 346 ug/dL (ref 250–450)
UIBC: 272 ug/dL (ref 148–442)

## 2023-08-07 LAB — FERRITIN: Ferritin: 120 ng/mL (ref 11–307)

## 2023-08-07 LAB — RETICULOCYTES
Immature Retic Fract: 14.8 % (ref 2.3–15.9)
RBC.: 3.9 MIL/uL (ref 3.87–5.11)
Retic Count, Absolute: 133 10*3/uL (ref 19.0–186.0)
Retic Ct Pct: 3.4 % — ABNORMAL HIGH (ref 0.4–3.1)

## 2023-08-07 NOTE — Progress Notes (Signed)
Hematology and Oncology Follow Up Visit  Heather SILVEIRA [id number] 1986-12-21 37 y.o. 08/07/2023  Past Medical History:  Diagnosis Date   Allergy    Latex   Anxiety    Asthma    Depression    GERD (gastroesophageal reflux disease)    Hypertension    Thyroid disease     Principle Diagnosis:  Anemia  Current Therapy:   IV Iron    Interim History:  [name] is back for a scheduled follow-up for her anemia.   She has had full GI work up without significant abnormal findings. She has had cardiology work up related to her symptoms without abnormality  She had full IIH work up at [facility] which confirmed the diagnosis. She has been taking 40 mg lasix BID with mild improvement.     In terms of her IDA she denies any new bleeding or bruising episodes.  She is tolerating IV iron well when needed.   No SOB, hematuria, hemoptysis, epistaxis, melena. Continues to have fatigue.      Wt Readings from Last 3 Encounters:  08/07/23 241 lb (109.3 kg)  07/12/23 237 lb 1.9 oz (107.6 kg)  07/04/23 241 lb (109.3 kg)     Medications:   Current Outpatient Medications:    furosemide (LASIX) 20 MG tablet, Take 40 mg by mouth 2 (two) times daily., Disp: , Rfl:    AIMOVIG 140 MG/ML SOAJ, Inject 1 mL into the skin every 30 (thirty) days., Disp: , Rfl:    ALPRAZolam (XANAX) 0.25 MG tablet, Take 0.25 mg by mouth 2 (two) times daily as needed., Disp: , Rfl:    cetirizine (ZYRTEC) 10 MG tablet, Take 10 mg by mouth daily., Disp: , Rfl:    dicyclomine (BENTYL) 10 MG capsule, Take 10 mg by mouth 4 (four) times daily -  before meals and at bedtime., Disp: , Rfl:    EPINEPHRINE 0.3 mg/0.3 mL IJ SOAJ injection, INJECT ONE PEN INTO THE THIGH OR AS DIRECTED. AFTER ADMINISTRATION CALL 911. IF ANAPHYLACTIC SYMPTOMS PERSIST AFTER FIRST DOSE, MAY REPEAT DOSE IN 5 TO 15 MINUTES., Disp: 2 mL, Rfl: 0   gabapentin (NEURONTIN) 100 MG capsule, Take by mouth., Disp: , Rfl:    levothyroxine (SYNTHROID) 75 MCG tablet,  Take 75 mcg by mouth daily before breakfast., Disp: , Rfl:    Levothyroxine Sodium 200 MCG CAPS, Take 200 mcg by mouth daily before breakfast., Disp: 90 capsule, Rfl: 1   melatonin 5 MG TABS, Take by mouth., Disp: , Rfl:    MULTIPLE VITAMIN PO, Take by mouth., Disp: , Rfl:    norethindrone (AYGESTIN) 5 MG tablet, TAKE ONE TABLET BY MOUTH ONE TIME DAILY, Disp: 90 tablet, Rfl: 0   ondansetron (ZOFRAN-ODT) 4 MG disintegrating tablet, Take 1 tablet (4 mg total) by mouth every 8 (eight) hours as needed for nausea or vomiting., Disp: 20 tablet, Rfl: 0   pantoprazole (PROTONIX) 40 MG tablet, Take 1 tablet by mouth daily., Disp: , Rfl:    sertraline (ZOLOFT) 100 MG tablet, Take 1 tablet (100 mg total) by mouth daily., Disp: 90 tablet, Rfl: 1   UBRELVY 100 MG TABS, Take by mouth., Disp: , Rfl:   Allergies:  Allergies  Allergen Reactions   Amitriptyline Swelling and Hypertension    Generalized body swelling, but not throat, face or lips. Could not wake up.   Bee Venom Anaphylaxis   Fluarix Quadrivalent [Influenza Vac Split Quad] Other (See Comments)    Autoimmune response   Ivp Dye [  Iodinated Contrast Media] Other (See Comments)    Severe migraine. Patient stated,"this was at [facility]. I now have to be pre-medicated before any Imaging studies."   Bisoprolol-Hydrochlorothiazide Swelling    Generalized swelling but not the face or throat   Diclofenac Sodium Swelling    Generalized swelling but not the face or throat.   Latex Rash    Past Medical History, Surgical history, Social history, and Family History were reviewed and updated.  Review of Systems: Review of Systems  Constitutional:  Positive for fatigue.  HENT:   Negative for nosebleeds.   Respiratory:  Negative for shortness of breath.   Gastrointestinal:  Negative for blood in stool.  Genitourinary:  Negative for hematuria.   Neurological:  Positive for dizziness.     Physical Exam:  height is 5\' 5"  (1.651 m) and weight is 241 lb  (109.3 kg). Her oral temperature is 98.2 F (36.8 C). Her blood pressure is 122/91 (abnormal) and her pulse is 74. Her respiration is 16 and oxygen saturation is 99%.   Physical Exam Vitals and nursing note reviewed.  Constitutional:      General: She is not in acute distress.    Appearance: Normal appearance. She is obese. She is not ill-appearing, toxic-appearing or diaphoretic.  Cardiovascular:     Rate and Rhythm: Normal rate and regular rhythm.     Heart sounds: Normal heart sounds.  Pulmonary:     Effort: Pulmonary effort is normal.     Breath sounds: Normal breath sounds.  Skin:    General: Skin is warm.     Coloration: Skin is not pale.  Neurological:     General: No focal deficit present.     Mental Status: She is alert and oriented to person, place, and time.    Lab Results  Component Value Date   WBC 6.5 08/07/2023   HGB 13.2 08/07/2023   HCT 38.9 08/07/2023   MCV 97.5 08/07/2023   PLT 291 08/07/2023     Chemistry      Component Value Date/Time   NA 139 07/12/2023 0855   K 4.5 07/12/2023 0855   CL 103 07/12/2023 0855   CO2 28 07/12/2023 0855   BUN 11 07/12/2023 0855   CREATININE 0.74 07/12/2023 0855   CREATININE 0.82 07/04/2023 1428      Component Value Date/Time   CALCIUM 9.8 07/12/2023 0855   ALKPHOS 65 07/12/2023 0855   AST 14 (L) 07/12/2023 0855   ALT 25 07/12/2023 0855   BILITOT 0.4 07/12/2023 0855      Impression and Plan:  Heather Krause is a 37 y.o. female with IDA:   1. Iron deficiency anemia, unspecified iron deficiency anemia type  Has had full GI work up without cause. Has also had cardiology work up. Now having neurosurgery work up. Doing well with PRN IV iron. CBC looks reassuring today. Iron studied pending. Will supplement with IV iron if needed.   Disposition: RTC 1 months APP, labs (CBC w/, CMP, iron, ferritin, retic) -[place]   Clent Jacks PA-C 8/13/202410:07 AM

## 2023-08-09 ENCOUNTER — Inpatient Hospital Stay: Payer: No Typology Code available for payment source

## 2023-08-09 ENCOUNTER — Ambulatory Visit: Payer: No Typology Code available for payment source | Admitting: Medical Oncology

## 2023-08-15 DIAGNOSIS — M7660 Achilles tendinitis, unspecified leg: Secondary | ICD-10-CM | POA: Insufficient documentation

## 2023-08-30 ENCOUNTER — Other Ambulatory Visit: Payer: Self-pay | Admitting: Family Medicine

## 2023-09-04 ENCOUNTER — Other Ambulatory Visit: Payer: Self-pay

## 2023-09-04 ENCOUNTER — Inpatient Hospital Stay (HOSPITAL_BASED_OUTPATIENT_CLINIC_OR_DEPARTMENT_OTHER): Payer: No Typology Code available for payment source | Admitting: Medical Oncology

## 2023-09-04 ENCOUNTER — Encounter: Payer: Self-pay | Admitting: Medical Oncology

## 2023-09-04 ENCOUNTER — Inpatient Hospital Stay: Payer: No Typology Code available for payment source | Attending: Hematology & Oncology

## 2023-09-04 VITALS — BP 142/98 | HR 63 | Temp 98.6°F | Resp 18 | Ht 65.0 in | Wt 240.0 lb

## 2023-09-04 DIAGNOSIS — D509 Iron deficiency anemia, unspecified: Secondary | ICD-10-CM | POA: Diagnosis not present

## 2023-09-04 LAB — IRON AND IRON BINDING CAPACITY (CC-WL,HP ONLY)
Iron: 105 ug/dL (ref 28–170)
Saturation Ratios: 26 % (ref 10.4–31.8)
TIBC: 402 ug/dL (ref 250–450)
UIBC: 297 ug/dL (ref 148–442)

## 2023-09-04 LAB — CBC
HCT: 41.6 % (ref 36.0–46.0)
Hemoglobin: 14.2 g/dL (ref 12.0–15.0)
MCH: 33.7 pg (ref 26.0–34.0)
MCHC: 34.1 g/dL (ref 30.0–36.0)
MCV: 98.8 fL (ref 80.0–100.0)
Platelets: 283 10*3/uL (ref 150–400)
RBC: 4.21 MIL/uL (ref 3.87–5.11)
RDW: 13 % (ref 11.5–15.5)
WBC: 5.7 10*3/uL (ref 4.0–10.5)
nRBC: 0 % (ref 0.0–0.2)

## 2023-09-04 LAB — RETIC PANEL
Immature Retic Fract: 10.3 % (ref 2.3–15.9)
RBC.: 4.16 MIL/uL (ref 3.87–5.11)
Retic Count, Absolute: 96.5 10*3/uL (ref 19.0–186.0)
Retic Ct Pct: 2.3 % (ref 0.4–3.1)
Reticulocyte Hemoglobin: 36.1 pg (ref >27.9)

## 2023-09-04 LAB — FERRITIN: Ferritin: 74 ng/mL (ref 11–307)

## 2023-09-04 NOTE — Progress Notes (Signed)
Hematology and Oncology Follow Up Visit  Heather Krause [id number] 1986/04/07 37 y.o. 09/04/2023  Past Medical History:  Diagnosis Date   Allergy    Latex   Anxiety    Asthma    Depression    GERD (gastroesophageal reflux disease)    Hypertension    Thyroid disease     Principle Diagnosis:  Anemia  Current Therapy:   IV Iron    Interim History:  Heather Krause is back for a scheduled follow-up for her anemia.   She has had full GI work up without significant abnormal findings. She has had cardiology work up related to her symptoms without abnormality  She had full IIH work up at [facility] which confirmed the diagnosis. She has been taking 40 mg lasix BID with mild improvement. She is considering carotid stent surgery but is working with her [facility] team as this is a newer treatment for her IIH.   Recently diagnosed with achilles tendonitis. She is on prednisone for this starting as of today.   In terms of her IDA she denies any new bleeding or bruising episodes.  She is tolerating IV iron well when needed.   No SOB, hematuria, hemoptysis, epistaxis, melena. Continues to have fatigue.      Wt Readings from Last 3 Encounters:  08/07/23 241 lb (109.3 kg)  07/12/23 237 lb 1.9 oz (107.6 kg)  07/04/23 241 lb (109.3 kg)     Medications:   Current Outpatient Medications:    AIMOVIG 140 MG/ML SOAJ, Inject 1 mL into the skin every 30 (thirty) days., Disp: , Rfl:    ALPRAZolam (XANAX) 0.25 MG tablet, Take 0.25 mg by mouth 2 (two) times daily as needed., Disp: , Rfl:    cetirizine (ZYRTEC) 10 MG tablet, Take 10 mg by mouth daily., Disp: , Rfl:    dicyclomine (BENTYL) 10 MG capsule, Take 10 mg by mouth 4 (four) times daily -  before meals and at bedtime., Disp: , Rfl:    EPINEPHRINE 0.3 mg/0.3 mL IJ SOAJ injection, INJECT ONE PEN INTO THE THIGH OR AS DIRECTED. AFTER ADMINISTRATION CALL 911. IF ANAPHYLACTIC SYMPTOMS PERSIST AFTER FIRST DOSE, MAY REPEAT DOSE IN 5 TO 15 MINUTES., Disp: 2  mL, Rfl: 0   furosemide (LASIX) 20 MG tablet, Take 40 mg by mouth 2 (two) times daily., Disp: , Rfl:    gabapentin (NEURONTIN) 100 MG capsule, Take by mouth., Disp: , Rfl:    levothyroxine (SYNTHROID) 75 MCG tablet, Take 75 mcg by mouth daily before breakfast., Disp: , Rfl:    Levothyroxine Sodium 200 MCG CAPS, Take 200 mcg by mouth daily before breakfast., Disp: 90 capsule, Rfl: 1   melatonin 5 MG TABS, Take by mouth., Disp: , Rfl:    MULTIPLE VITAMIN PO, Take by mouth., Disp: , Rfl:    norethindrone (AYGESTIN) 5 MG tablet, TAKE ONE TABLET BY MOUTH ONE TIME DAILY, Disp: 90 tablet, Rfl: 0   ondansetron (ZOFRAN-ODT) 4 MG disintegrating tablet, Take 1 tablet (4 mg total) by mouth every 8 (eight) hours as needed for nausea or vomiting., Disp: 20 tablet, Rfl: 0   pantoprazole (PROTONIX) 40 MG tablet, Take 1 tablet by mouth daily., Disp: , Rfl:    sertraline (ZOLOFT) 100 MG tablet, TAKE ONE TABLET BY MOUTH ONE TIME DAILY, Disp: 90 tablet, Rfl: 1   UBRELVY 100 MG TABS, Take by mouth., Disp: , Rfl:   Allergies:  Allergies  Allergen Reactions   Amitriptyline Swelling and Hypertension    Generalized body  swelling, but not throat, face or lips. Could not wake up.   Bee Venom Anaphylaxis   Fluarix Quadrivalent [Influenza Vac Split Quad] Other (See Comments)    Autoimmune response   Ivp Dye [Iodinated Contrast Media] Other (See Comments)    Severe migraine. Patient stated,"this was at [facility]. I now have to be pre-medicated before any Imaging studies."   Bisoprolol-Hydrochlorothiazide Swelling    Generalized swelling but not the face or throat   Diclofenac Sodium Swelling    Generalized swelling but not the face or throat.   Latex Rash    Past Medical History, Surgical history, Social history, and Family History were reviewed and updated.  Review of Systems: Review of Systems  Constitutional:  Positive for fatigue.  HENT:   Negative for nosebleeds.   Respiratory:  Negative for shortness of  breath.   Gastrointestinal:  Negative for blood in stool.  Genitourinary:  Negative for hematuria.   Neurological:  Positive for dizziness.     Physical Exam:  vitals were not taken for this visit.   Physical Exam Vitals and nursing note reviewed.  Constitutional:      General: She is not in acute distress.    Appearance: Normal appearance. She is obese. She is not ill-appearing, toxic-appearing or diaphoretic.  Cardiovascular:     Rate and Rhythm: Normal rate and regular rhythm.     Heart sounds: Normal heart sounds.  Pulmonary:     Effort: Pulmonary effort is normal.     Breath sounds: Normal breath sounds.  Skin:    General: Skin is warm.     Coloration: Skin is not pale.  Neurological:     General: No focal deficit present.     Mental Status: She is alert and oriented to person, place, and time.    Lab Results  Component Value Date   WBC 5.7 09/04/2023   HGB 14.2 09/04/2023   HCT 41.6 09/04/2023   MCV 98.8 09/04/2023   PLT 283 09/04/2023     Chemistry      Component Value Date/Time   NA 139 07/12/2023 0855   K 4.5 07/12/2023 0855   CL 103 07/12/2023 0855   CO2 28 07/12/2023 0855   BUN 11 07/12/2023 0855   CREATININE 0.74 07/12/2023 0855   CREATININE 0.82 07/04/2023 1428      Component Value Date/Time   CALCIUM 9.8 07/12/2023 0855   ALKPHOS 65 07/12/2023 0855   AST 14 (L) 07/12/2023 0855   ALT 25 07/12/2023 0855   BILITOT 0.4 07/12/2023 0855      Impression and Plan:  Heather Krause is a 37 y.o. female with IDA:   1. Iron deficiency anemia, unspecified iron deficiency anemia type   IDA is chronic in nature. Has had full GI work up without cause. Has also had cardiology work up. May be an inflammatory component given her other conditions. For now she is tolerating PRN Iv iron well. CBC looks reassuring today. Iron studied pending. Will supplement with IV iron if needed.   Disposition: RTC 1 months APP, labs (CBC w/, CMP, iron, ferritin, retic)  -[place]   Clent Jacks PA-C 9/10/20249:02 AM

## 2023-10-01 ENCOUNTER — Other Ambulatory Visit: Payer: Self-pay | Admitting: Family Medicine

## 2023-10-08 ENCOUNTER — Inpatient Hospital Stay: Payer: No Typology Code available for payment source | Attending: Hematology & Oncology

## 2023-10-08 ENCOUNTER — Encounter: Payer: Self-pay | Admitting: Medical Oncology

## 2023-10-08 ENCOUNTER — Inpatient Hospital Stay (HOSPITAL_BASED_OUTPATIENT_CLINIC_OR_DEPARTMENT_OTHER): Payer: No Typology Code available for payment source | Admitting: Medical Oncology

## 2023-10-08 ENCOUNTER — Other Ambulatory Visit: Payer: Self-pay

## 2023-10-08 VITALS — BP 131/98 | HR 72 | Temp 98.2°F | Resp 18 | Ht 65.0 in | Wt 244.1 lb

## 2023-10-08 DIAGNOSIS — D509 Iron deficiency anemia, unspecified: Secondary | ICD-10-CM | POA: Insufficient documentation

## 2023-10-08 LAB — CMP (CANCER CENTER ONLY)
ALT: 33 U/L (ref 0–44)
AST: 18 U/L (ref 15–41)
Albumin: 4.1 g/dL (ref 3.5–5.0)
Alkaline Phosphatase: 53 U/L (ref 38–126)
Anion gap: 8 (ref 5–15)
BUN: 11 mg/dL (ref 6–20)
CO2: 27 mmol/L (ref 22–32)
Calcium: 9 mg/dL (ref 8.9–10.3)
Chloride: 105 mmol/L (ref 98–111)
Creatinine: 0.89 mg/dL (ref 0.44–1.00)
GFR, Estimated: >60 mL/min (ref >60)
Glucose, Bld: 90 mg/dL (ref 70–99)
Potassium: 4.3 mmol/L (ref 3.5–5.1)
Sodium: 140 mmol/L (ref 135–145)
Total Bilirubin: 0.2 mg/dL — ABNORMAL LOW (ref 0.3–1.2)
Total Protein: 6.9 g/dL (ref 6.5–8.1)

## 2023-10-08 LAB — IRON AND IRON BINDING CAPACITY (CC-WL,HP ONLY)
Iron: 83 ug/dL (ref 28–170)
Saturation Ratios: 21 % (ref 10.4–31.8)
TIBC: 400 ug/dL (ref 250–450)
UIBC: 317 ug/dL (ref 148–442)

## 2023-10-08 LAB — CBC
HCT: 41.5 % (ref 36.0–46.0)
Hemoglobin: 14.4 g/dL (ref 12.0–15.0)
MCH: 33.6 pg (ref 26.0–34.0)
MCHC: 34.7 g/dL (ref 30.0–36.0)
MCV: 96.7 fL (ref 80.0–100.0)
Platelets: 278 10*3/uL (ref 150–400)
RBC: 4.29 MIL/uL (ref 3.87–5.11)
RDW: 12.3 % (ref 11.5–15.5)
WBC: 6.1 10*3/uL (ref 4.0–10.5)
nRBC: 0 % (ref 0.0–0.2)

## 2023-10-08 LAB — FERRITIN: Ferritin: 53 ng/mL (ref 11–307)

## 2023-10-08 LAB — RETIC PANEL
Immature Retic Fract: 8.3 % (ref 2.3–15.9)
RBC.: 4.3 MIL/uL (ref 3.87–5.11)
Retic Count, Absolute: 84.7 10*3/uL (ref 19.0–186.0)
Retic Ct Pct: 2 % (ref 0.4–3.1)
Reticulocyte Hemoglobin: 35.4 pg (ref >27.9)
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
## 2023-10-22 ENCOUNTER — Encounter: Payer: Self-pay | Admitting: Family Medicine

## 2023-10-22 DIAGNOSIS — R600 Localized edema: Secondary | ICD-10-CM

## 2023-10-29 ENCOUNTER — Other Ambulatory Visit: Payer: Self-pay | Admitting: *Deleted

## 2023-10-29 DIAGNOSIS — R600 Localized edema: Secondary | ICD-10-CM

## 2023-11-05 ENCOUNTER — Inpatient Hospital Stay: Payer: No Typology Code available for payment source | Attending: Hematology & Oncology

## 2023-11-05 ENCOUNTER — Ambulatory Visit (INDEPENDENT_AMBULATORY_CARE_PROVIDER_SITE_OTHER)
Admission: RE | Admit: 2023-11-05 | Discharge: 2023-11-05 | Disposition: A | Discharge status: Home / Self Care | Payer: No Typology Code available for payment source | Source: Ambulatory Visit | Attending: Surgery | Admitting: Surgery

## 2023-11-05 DIAGNOSIS — R600 Localized edema: Secondary | ICD-10-CM | POA: Insufficient documentation

## 2023-11-05 DIAGNOSIS — D509 Iron deficiency anemia, unspecified: Secondary | ICD-10-CM | POA: Insufficient documentation

## 2023-11-05 DIAGNOSIS — I872 Venous insufficiency (chronic) (peripheral): Secondary | ICD-10-CM | POA: Insufficient documentation

## 2023-11-05 LAB — RETICULOCYTES
Immature Retic Fract: 14.6 % (ref 2.3–15.9)
RBC.: 4.02 MIL/uL (ref 3.87–5.11)
Retic Count, Absolute: 99.3 10*3/uL (ref 19.0–186.0)
Retic Ct Pct: 2.5 % (ref 0.4–3.1)

## 2023-11-05 LAB — FERRITIN: Ferritin: 45 ng/mL (ref 11–307)

## 2023-11-05 LAB — CBC
HCT: 39.6 % (ref 36.0–46.0)
Hemoglobin: 13.5 g/dL (ref 12.0–15.0)
MCH: 33.4 pg (ref 26.0–34.0)
MCHC: 34.1 g/dL (ref 30.0–36.0)
MCV: 98 fL (ref 80.0–100.0)
Platelets: 288 10*3/uL (ref 150–400)
RBC: 4.04 MIL/uL (ref 3.87–5.11)
RDW: 12.4 % (ref 11.5–15.5)
WBC: 6.5 10*3/uL (ref 4.0–10.5)
nRBC: 0 % (ref 0.0–0.2)

## 2023-11-06 ENCOUNTER — Other Ambulatory Visit: Payer: Self-pay | Admitting: Family Medicine

## 2023-11-06 LAB — IRON AND IRON BINDING CAPACITY (CC-WL,HP ONLY)
Iron: 99 ug/dL (ref 28–170)
Saturation Ratios: 27 % (ref 10.4–31.8)
TIBC: 372 ug/dL (ref 250–450)
UIBC: 273 ug/dL (ref 148–442)

## 2023-11-08 ENCOUNTER — Encounter: Payer: Self-pay | Admitting: Family Medicine

## 2023-11-08 ENCOUNTER — Inpatient Hospital Stay: Payer: No Typology Code available for payment source

## 2023-11-08 DIAGNOSIS — E063 Autoimmune thyroiditis: Secondary | ICD-10-CM

## 2023-11-15 ENCOUNTER — Ambulatory Visit (INDEPENDENT_AMBULATORY_CARE_PROVIDER_SITE_OTHER): Payer: No Typology Code available for payment source | Admitting: Family Medicine

## 2023-11-15 ENCOUNTER — Ambulatory Visit (INDEPENDENT_AMBULATORY_CARE_PROVIDER_SITE_OTHER): Payer: No Typology Code available for payment source | Admitting: Physician Assistant

## 2023-11-15 ENCOUNTER — Encounter: Payer: Self-pay | Admitting: Family Medicine

## 2023-11-15 VITALS — BP 138/89 | HR 68 | Ht 65.0 in | Wt 246.0 lb

## 2023-11-15 VITALS — BP 141/94 | HR 75 | Temp 98.2°F | Ht 65.0 in | Wt 245.2 lb

## 2023-11-15 DIAGNOSIS — E063 Autoimmune thyroiditis: Secondary | ICD-10-CM

## 2023-11-15 DIAGNOSIS — I872 Venous insufficiency (chronic) (peripheral): Secondary | ICD-10-CM

## 2023-11-15 DIAGNOSIS — M48061 Spinal stenosis, lumbar region without neurogenic claudication: Secondary | ICD-10-CM | POA: Insufficient documentation

## 2023-11-15 DIAGNOSIS — M7989 Other specified soft tissue disorders: Secondary | ICD-10-CM

## 2023-11-15 MED ORDER — PREDNISONE 50 MG PO TABS: ORAL_TABLET | ORAL | 0 refills | Status: DC

## 2023-11-15 MED ORDER — LEVOTHYROXINE SODIUM 300 MCG PO TABS: ORAL_TABLET | ORAL | 1 refills | Status: DC

## 2023-11-15 NOTE — Assessment & Plan Note (Signed)
Renewals of medication sent in.  Repeat TSH and T4 in 6-8 weeks.

## 2023-11-15 NOTE — Progress Notes (Signed)
[facility]   Reason for referral: Swollen B leg  History of Present Illness  Heather Krause is a 37 y.o. female who presents with chief complaint: swollen leg.  Patient notes, onset of swelling >6 months ago, associated with prolonged sitting.  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   The patient has had no history of DVT, no history of varicose vein, no history of venous stasis ulcers, no history of  Lymphedema and no history of skin changes in lower legs.  There is no family history of venous disorders.  The patient has not used compression stockings in the past.  She has a seated job.  She works from home and does not exercise regularly.  She is worried about hurting her achillis.  She denies CHF and CKD.  Past Medical History:  Diagnosis Date   Allergy    Latex   Anxiety    Asthma    Depression    GERD (gastroesophageal reflux disease)    Hypertension    Thyroid disease     Past Surgical History:  Procedure Laterality Date   CHOLECYSTECTOMY     ESOPHAGUS SURGERY      Social History   Socioeconomic History   Marital status: Married    Spouse name: Not on file   Number of children: Not on file   Years of education: Not on file   Highest education level: Associate degree: occupational, Scientist, product/process development, or vocational program  Occupational History   Not on file  Tobacco Use   Smoking status: Never    Passive exposure: Never   Smokeless tobacco: Never  Vaping Use   Vaping status: Never Used  Substance and Sexual Activity   Alcohol use: Yes    Comment: Occasionally   Drug use: No   Sexual activity: Yes    Partners: Male    Birth control/protection: None  Other Topics Concern   Not on file  Social History Narrative   Not on file   Social Determinants of Health   Financial Resource Strain: Low Risk  (11/15/2023)   Overall Financial Resource Strain (CARDIA)    Difficulty of Paying Living Expenses: Not hard at  all  Food Insecurity: No Food Insecurity (11/15/2023)   Hunger Vital Sign    Worried About Running Out of Food in the Last Year: Never true    Ran Out of Food in the Last Year: Never true  Transportation Needs: No Transportation Needs (11/15/2023)   PRAPARE - Administrator, Civil Service (Medical): No    Lack of Transportation (Non-Medical): No  Physical Activity: Inactive (11/15/2023)   Exercise Vital Sign    Days of Exercise per Week: 0 days    Minutes of Exercise per Session: 20 min  Stress: No Stress Concern Present (11/15/2023)   [employer] of Occupational Health - Occupational Stress Questionnaire    Feeling of Stress : Not at all  Social Connections: Unknown (11/15/2023)   Social Connection and Isolation Panel [NHANES]    Frequency of Communication with Friends and Family: More than three times a week    Frequency of Social Gatherings with Friends and Family: Three times a week    Attends Religious Services: Patient declined    Active Member of Clubs or Organizations: Yes    Attends Banker Meetings: More than 4 times per year    Marital Status: Married  Intimate Partner Violence: Not At Risk (02/27/2023)   Received  from [facility]   HITS    Over the last 12 months how often did your partner physically hurt you?: Never    Over the last 12 months how often did your partner insult you or talk down to you?: Never    Over the last 12 months how often did your partner threaten you with physical harm?: Never    Over the last 12 months how often did your partner scream or curse at you?: Never    Family History  Problem Relation Age of Onset   Hypertension Mother    Aneurysm Mother    Anxiety disorder Mother    Arthritis Mother    Depression Mother    Hypertension Father    Arthritis Father    Cancer Maternal Grandmother    Diabetes Maternal Grandmother    Kidney disease Maternal Grandmother    Vision loss Maternal  Grandmother    Varicose Veins Maternal Grandmother    Asthma Maternal Grandfather    Heart disease Paternal Grandfather     Current Outpatient Medications on File Prior to Visit  Medication Sig Dispense Refill   AIMOVIG 140 MG/ML SOAJ Inject 1 mL into the skin every 30 (thirty) days.     ALPRAZolam (XANAX) 0.25 MG tablet Take 0.25 mg by mouth 2 (two) times daily as needed.     cetirizine (ZYRTEC) 10 MG tablet Take 10 mg by mouth daily.     dicyclomine (BENTYL) 10 MG capsule Take 10 mg by mouth 4 (four) times daily -  before meals and at bedtime.     EPINEPHRINE 0.3 mg/0.3 mL IJ SOAJ injection INJECT ONE PEN INTO THE THIGH OR AS DIRECTED. AFTER ADMINISTRATION CALL 911. IF ANAPHYLACTIC SYMPTOMS PERSIST AFTER FIRST DOSE, MAY REPEAT DOSE IN 5 TO 15 MINUTES. 2 mL 0   gabapentin (NEURONTIN) 100 MG capsule Take by mouth.     levothyroxine (SYNTHROID) 75 MCG tablet Take 75 mcg by mouth daily before breakfast.     Levothyroxine Sodium 200 MCG CAPS Take 200 mcg by mouth daily before breakfast. 90 capsule 1   melatonin 5 MG TABS Take by mouth.     meloxicam (MOBIC) 15 MG tablet Take 1 tablet by mouth daily.     MULTIPLE VITAMIN PO Take by mouth.     norethindrone (AYGESTIN) 5 MG tablet TAKE ONE TABLET BY MOUTH ONE TIME DAILY 90 tablet 0   ondansetron (ZOFRAN-ODT) 4 MG disintegrating tablet Take 1 tablet (4 mg total) by mouth every 8 (eight) hours as needed for nausea or vomiting. 20 tablet 0   sertraline (ZOLOFT) 100 MG tablet TAKE ONE TABLET BY MOUTH ONE TIME DAILY 90 tablet 1   UBRELVY 100 MG TABS Take by mouth.     furosemide (LASIX) 20 MG tablet Take 40 mg by mouth 2 (two) times daily.     pantoprazole (PROTONIX) 40 MG tablet Take 1 tablet by mouth daily.     No current facility-administered medications on file prior to visit.    Allergies as of 11/15/2023 - Review Complete 11/15/2023  Allergen Reaction Noted   Amitriptyline Swelling and Hypertension 07/09/2017   Bee venom Anaphylaxis  09/18/2017   Fluarix quadrivalent [influenza vac split quad] Other (See Comments) 05/31/2018   Ivp dye [iodinated contrast media] Other (See Comments) 07/12/2023   Bisoprolol-hydrochlorothiazide Swelling 08/10/2017   Diclofenac sodium Swelling 09/09/2018   Latex Rash 11/17/2014     ROS:   General:  No weight loss, Fever, chills  HEENT: No  recent headaches, no nasal bleeding, no visual changes, no sore throat  Neurologic: No dizziness, blackouts, seizures. No recent symptoms of stroke or mini- stroke. No recent episodes of slurred speech, or temporary blindness.  Cardiac: No recent episodes of chest pain/pressure, no shortness of breath at rest.  No shortness of breath with exertion.  Denies history of atrial fibrillation or irregular heartbeat  Vascular: No history of rest pain in feet.  No history of claudication.  No history of non-healing ulcer, No history of DVT   Pulmonary: No home oxygen, no productive cough, no hemoptysis,  No asthma or wheezing  Musculoskeletal:  [ ]  Arthritis, [ ]  Low back pain,  [ x] Joint pain  Hematologic:No history of hypercoagulable state.  No history of easy bleeding.  No history of anemia  Gastrointestinal: No hematochezia or melena,  No gastroesophageal reflux, no trouble swallowing  Urinary: [ ]  chronic Kidney disease, [ ]  on HD - [ ]  MWF or [ ]  TTHS, [ ]  Burning with urination, [ ]  Frequent urination, [ ]  Difficulty urinating;   Skin: No rashes  Psychological: Positive history of anxiety,  positive history of depression  Physical Examination  Vitals:   11/15/23 0901  BP: (!) 141/94  Pulse: 75  Temp: 98.2 F (36.8 C)  TempSrc: Temporal  SpO2: 98%  Weight: 245 lb 3.2 oz (111.2 kg)  Height: 5\' 5"  (1.651 m)    Body mass index is 40.8 kg/m.  General:  Alert and oriented, no acute distress HEENT: Normal Neck: No bruit or JVD Pulmonary: Clear to auscultation bilaterally Cardiac: Regular Rate and Rhythm without murmur Abdomen: Soft,  non-tender, non-distended, no mass, no scars Skin: No rash Extremity Pulses:  2+ radial,  femoral, dorsalis pedis, pulses bilaterally Musculoskeletal: No deformity.  No apparent edema or skin changes Neurologic: Upper and lower extremity motor 5/5 and symmetric  DATA: +--------------+---------+------+-----------+------------+---------+  LEFT         Reflux NoRefluxReflux TimeDiameter cmsComments                           Yes                                    +--------------+---------+------+-----------+------------+---------+  CFV          no                                               +--------------+---------+------+-----------+------------+---------+  FV mid        no                                               +--------------+---------+------+-----------+------------+---------+  Popliteal              yes   >1 second                        +--------------+---------+------+-----------+------------+---------+  GSV at SFJ              yes    >500 ms      0.77               +--------------+---------+------+-----------+------------+---------+  GSV prox thigh          yes    >500 ms      0.67               +--------------+---------+------+-----------+------------+---------+  GSV mid thigh           yes    >500 ms      0.26               +--------------+---------+------+-----------+------------+---------+  GSV dist thigh          yes    >500 ms      0.36               +--------------+---------+------+-----------+------------+---------+  GSV at knee             yes    >500 ms      0.27               +--------------+---------+------+-----------+------------+---------+  GSV prox calf no                            0.26               +--------------+---------+------+-----------+------------+---------+  GSV mid calf            yes    >500 ms      0.21                +--------------+---------+------+-----------+------------+---------+  SSV Pop Fossa                               0.19               +--------------+---------+------+-----------+------------+---------+  SSV prox calf no                            0.21               +--------------+---------+------+-----------+------------+---------+  SSV mid calf  no                            0.21               +--------------+---------+------+-----------+------------+---------+  AASV O                  yes    >500 ms      0.31               +--------------+---------+------+-----------+------------+---------+  AASV P        no                            0.20               +--------------+---------+------+-----------+------------+---------+  AASV M                                              too small  +--------------+---------+------+-----------+------------+---------+         Summary:  Left:  - No evidence of deep vein thrombosis seen in the left lower extremity,  from the common femoral through the popliteal veins.  - No evidence of superficial venous thrombosis in the left lower  extremity.  - No evidence of superficial venous reflux seen in the left short  saphenous vein.  - Venous reflux is noted in the left sapheno-femoral junction.  - Venous reflux is noted in the left greater saphenous vein in the thigh.  - Venous reflux is noted in the left greater saphenous vein in the calf.  - Venous reflux is noted in the left popliteal vein.  - Venous reflux is noted at the proximal AASV     Assessment/Plan: Venous reflux with mild edema and no skin changes No evidence of DVT - Venous reflux is noted in the left sapheno-femoral junction.  - Venous reflux is noted in the left greater saphenous vein in the thigh.  - Venous reflux is noted in the left greater saphenous vein in the calf.  - Venous reflux is noted in the left popliteal vein.  - Venous reflux  is noted at the proximal AASV   I will have her start an exercise program to lose weight with low weights and multiple reps.  She needs to stretch her calve muscle multiple times a day.  I demonstrated this to her.  She should get a heel wedge to wear in her most stable shoe to keep the strain off her achillis.  Compression with thigh high compression socks will held with the edema and the achillis.  Ice multiple times a day.  Supine leg elevation for edema.    If she loses weight she may be a candidate for vein intervention.  If she develops ulcers, skin changes or weeping of the skin she will call for a follow up.  She is not at risk of limb loss she has palpable pedal pulses.       Mosetta Pigeon PA-C [facility] [place] Office: [phone]  MD in clinic [place]

## 2023-11-15 NOTE — Progress Notes (Signed)
Heather Krause - 37 y.o. female MRN [id number]  Date of birth: 09-28-1986  Subjective Chief Complaint  Patient presents with   Back Pain    HPI Heather Krause is a 37 y.o. female here today for follow up of back pain.  She has acute on chronic back pain.  She has seen ortho spine in the pase..  She had in injection of her spine in 2022.  MRI at that time did show mild/moderate foraminal stenosis and L4-L5 and L5-S1 bilaterally.  She notes that her pain at this time tends to worsen with standing for prolonged periods of time.  She has tried OTC lidocaine and NSAIDS with mild improvement.  She does have some radiation into the L leg.  Denies weakness   She has also had continued issues with endocrinology.  Increase to 300+73mcg tablet of levothyroxine, however was never sent in.  Has pending appt with new endocrinologist.  Would like for me to send this over until seen by new endocrinologist.   ROS:  A comprehensive ROS was completed and negative except as noted per HPI  Allergies  Allergen Reactions   Amitriptyline Swelling and Hypertension    Generalized body swelling, but not throat, face or lips. Could not wake up.   Bee Venom Anaphylaxis   Fluarix Quadrivalent [Influenza Vac Split Quad] Other (See Comments)    Autoimmune response   Ivp Dye [Iodinated Contrast Media] Other (See Comments)    Severe migraine. Patient stated,"this was at [facility]. I now have to be pre-medicated before any Imaging studies."   Bisoprolol-Hydrochlorothiazide Swelling    Generalized swelling but not the face or throat   Diclofenac Sodium Swelling    Generalized swelling but not the face or throat.   Latex Rash    Past Medical History:  Diagnosis Date   Allergy    Latex   Anxiety    Asthma    Depression    GERD (gastroesophageal reflux disease)    Hypertension    Thyroid disease     Past Surgical History:  Procedure Laterality Date   CHOLECYSTECTOMY     ESOPHAGUS SURGERY       Social History   Socioeconomic History   Marital status: Married    Spouse name: Not on file   Number of children: Not on file   Years of education: Not on file   Highest education level: Associate degree: occupational, Scientist, product/process development, or vocational program  Occupational History   Not on file  Tobacco Use   Smoking status: Never    Passive exposure: Never   Smokeless tobacco: Never  Vaping Use   Vaping status: Never Used  Substance and Sexual Activity   Alcohol use: Yes    Comment: Occasionally   Drug use: No   Sexual activity: Yes    Partners: Male    Birth control/protection: None  Other Topics Concern   Not on file  Social History Narrative   Not on file   Social Determinants of Health   Financial Resource Strain: Low Risk  (11/15/2023)   Overall Financial Resource Strain (CARDIA)    Difficulty of Paying Living Expenses: Not hard at all  Food Insecurity: No Food Insecurity (11/15/2023)   Hunger Vital Sign    Worried About Running Out of Food in the Last Year: Never true    Ran Out of Food in the Last Year: Never true  Transportation Needs: No Transportation Needs (11/15/2023)   PRAPARE - Transportation    Lack  of Transportation (Medical): No    Lack of Transportation (Non-Medical): No  Physical Activity: Inactive (11/15/2023)   Exercise Vital Sign    Days of Exercise per Week: 0 days    Minutes of Exercise per Session: 20 min  Stress: No Stress Concern Present (11/15/2023)   [employer] of Occupational Health - Occupational Stress Questionnaire    Feeling of Stress : Not at all  Social Connections: Unknown (11/15/2023)   Social Connection and Isolation Panel [NHANES]    Frequency of Communication with Friends and Family: More than three times a week    Frequency of Social Gatherings with Friends and Family: Three times a week    Attends Religious Services: Patient declined    Active Member of Clubs or Organizations: Yes    Attends Hospital doctor: More than 4 times per year    Marital Status: Married    Family History  Problem Relation Age of Onset   Hypertension Mother    Aneurysm Mother    Anxiety disorder Mother    Arthritis Mother    Depression Mother    Hypertension Father    Arthritis Father    Cancer Maternal Grandmother    Diabetes Maternal Grandmother    Kidney disease Maternal Grandmother    Vision loss Maternal Grandmother    Varicose Veins Maternal Grandmother    Asthma Maternal Grandfather    Heart disease Paternal Grandfather     Health Maintenance  Topic Date Due   HIV Screening  Never done   Hepatitis C Screening  Never done   Cervical Cancer Screening (HPV/Pap Cotest)  Never done   COVID-19 Vaccine (3 - 2023-24 season) 12/01/2023 (Originally 08/26/2023)   DTaP/Tdap/Td (2 - Td or Tdap) 12/20/2032   HPV VACCINES  Aged Out   INFLUENZA VACCINE  Discontinued     ----------------------------------------------------------------------------------------------------------------------------------------------------------------------------------------------------------------- Physical Exam BP 138/89 (BP Location: Left Arm, Patient Position: Sitting, Cuff Size: Large)   Pulse 68   Ht 5\' 5"  (1.651 m)   Wt 246 lb (111.6 kg)   SpO2 100%   BMI 40.94 kg/m   Physical Exam Constitutional:      Appearance: Normal appearance.  Eyes:     General: No scleral icterus. Cardiovascular:     Rate and Rhythm: Normal rate and regular rhythm.  Pulmonary:     Effort: Pulmonary effort is normal.     Breath sounds: Normal breath sounds.  Musculoskeletal:     Cervical back: Neck supple.  Neurological:     Mental Status: She is alert.  Psychiatric:        Mood and Affect: Mood normal.        Behavior: Behavior normal.      ------------------------------------------------------------------------------------------------------------------------------------------------------------------------------------------------------------------- Assessment and Plan  Hypothyroidism due to Hashimoto's thyroiditis Renewals of medication sent in.  Repeat TSH and T4 in 6-8 weeks.   Spinal stenosis of lumbar region without neurogenic claudication Will plan to refer to PT and update MRI.  Adding course of prednisone 50mg  daily x5 days. Red flags and precautions discussed.    Meds ordered this encounter  Medications   levothyroxine (SYNTHROID) 300 MCG tablet    Sig: Take with tablet.    Dispense:  90 tablet    Refill:  1   predniSONE (DELTASONE) 50 MG tablet    Sig: Take 1 tab po daily x5 days.    Dispense:  5 tablet    Refill:  0    No follow-ups on file.    This  visit occurred during the SARS-CoV-2 public health emergency.  Safety protocols were in place, including screening questions prior to the visit, additional usage of staff PPE, and extensive cleaning of exam room while observing appropriate contact time as indicated for disinfecting solutions.

## 2023-11-15 NOTE — Assessment & Plan Note (Signed)
Will plan to refer to PT and update MRI.  Adding course of prednisone 50mg  daily x5 days. Red flags and precautions discussed.

## 2023-11-16 ENCOUNTER — Other Ambulatory Visit: Payer: Self-pay | Admitting: Family Medicine

## 2023-11-16 MED ORDER — LEVOTHYROXINE SODIUM 300 MCG PO TABS: 300.0000 ug | ORAL_TABLET | Freq: Every day | ORAL | 1 refills | Status: DC

## 2023-11-19 ENCOUNTER — Encounter: Payer: Self-pay | Admitting: Family Medicine

## 2023-11-20 ENCOUNTER — Other Ambulatory Visit: Payer: No Typology Code available for payment source

## 2023-11-20 DIAGNOSIS — M48061 Spinal stenosis, lumbar region without neurogenic claudication: Secondary | ICD-10-CM | POA: Diagnosis not present

## 2023-11-26 ENCOUNTER — Other Ambulatory Visit: Payer: Self-pay

## 2023-11-26 ENCOUNTER — Ambulatory Visit: Payer: No Typology Code available for payment source | Attending: Family Medicine | Admitting: Physical Therapy

## 2023-11-26 ENCOUNTER — Encounter: Payer: Self-pay | Admitting: Physical Therapy

## 2023-11-26 DIAGNOSIS — R29898 Other symptoms and signs involving the musculoskeletal system: Secondary | ICD-10-CM | POA: Diagnosis not present

## 2023-11-26 DIAGNOSIS — M48061 Spinal stenosis, lumbar region without neurogenic claudication: Secondary | ICD-10-CM | POA: Diagnosis not present

## 2023-11-26 DIAGNOSIS — M5459 Other low back pain: Secondary | ICD-10-CM | POA: Diagnosis present
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
## 2023-11-28 ENCOUNTER — Ambulatory Visit: Payer: No Typology Code available for payment source | Admitting: Physical Therapy

## 2023-11-28 ENCOUNTER — Encounter: Payer: Self-pay | Admitting: Physical Therapy

## 2023-11-28 DIAGNOSIS — R29898 Other symptoms and signs involving the musculoskeletal system: Secondary | ICD-10-CM

## 2023-11-28 DIAGNOSIS — M5459 Other low back pain: Secondary | ICD-10-CM
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
## 2023-12-04 ENCOUNTER — Ambulatory Visit: Payer: No Typology Code available for payment source

## 2023-12-06 ENCOUNTER — Inpatient Hospital Stay: Payer: No Typology Code available for payment source | Attending: Hematology & Oncology

## 2023-12-06 ENCOUNTER — Encounter: Payer: Self-pay | Admitting: Medical Oncology

## 2023-12-06 ENCOUNTER — Inpatient Hospital Stay: Payer: No Typology Code available for payment source | Admitting: Medical Oncology

## 2023-12-06 VITALS — BP 129/90 | HR 85 | Temp 99.1°F | Resp 18 | Ht 65.0 in | Wt 243.0 lb

## 2023-12-06 DIAGNOSIS — R5383 Other fatigue: Secondary | ICD-10-CM | POA: Insufficient documentation

## 2023-12-06 DIAGNOSIS — Z79890 Hormone replacement therapy (postmenopausal): Secondary | ICD-10-CM | POA: Insufficient documentation

## 2023-12-06 DIAGNOSIS — D509 Iron deficiency anemia, unspecified: Secondary | ICD-10-CM

## 2023-12-06 LAB — CBC
HCT: 42.4 % (ref 36.0–46.0)
Hemoglobin: 14.6 g/dL (ref 12.0–15.0)
MCH: 33.3 pg (ref 26.0–34.0)
MCHC: 34.4 g/dL (ref 30.0–36.0)
MCV: 96.6 fL (ref 80.0–100.0)
Platelets: 321 10*3/uL (ref 150–400)
RBC: 4.39 MIL/uL (ref 3.87–5.11)
RDW: 12.6 % (ref 11.5–15.5)
WBC: 6.7 10*3/uL (ref 4.0–10.5)
nRBC: 0 % (ref 0.0–0.2)

## 2023-12-06 LAB — FERRITIN: Ferritin: 59 ng/mL (ref 11–307)

## 2023-12-06 LAB — IRON AND IRON BINDING CAPACITY (CC-WL,HP ONLY)
Iron: 93 ug/dL (ref 28–170)
Saturation Ratios: 23 % (ref 10.4–31.8)
TIBC: 409 ug/dL (ref 250–450)
UIBC: 316 ug/dL (ref 148–442)

## 2023-12-06 NOTE — Progress Notes (Signed)
Hematology and Oncology Follow Up Visit  Heather Krause [id number] [birth year] 37 y.o. 12/06/2023  Past Medical History:  Diagnosis Date   Allergy    Latex   Anxiety    Asthma    Depression    GERD (gastroesophageal reflux disease)    Hypertension    Thyroid disease     Principle Diagnosis:  Anemia-   Current Therapy:   IV Iron- Venofer- last dose 07/19/2023    Interim History:  Heather Krause is back for a scheduled follow-up for her anemia.   She had her LP at [facility]. She is waiting to hear back about her visits after that appointment.   Today she reports that she is doing ok overall. Fatigue is present and stable. Her PCP has now taken over her levothyroxine.   In terms of her IDA she denies any new bleeding or bruising episodes.  She is tolerating IV iron well when needed. She takes OCP to prevent menstrual cycles.   No SOB, hematuria, hemoptysis, epistaxis, melena. Continues to have fatigue.      Wt Readings from Last 3 Encounters:  12/06/23 243 lb (110.2 kg)  11/15/23 246 lb (111.6 kg)  11/15/23 245 lb 3.2 oz (111.2 kg)     Medications:   Current Outpatient Medications:    AIMOVIG 140 MG/ML SOAJ, Inject 1 mL into the skin every 30 (thirty) days., Disp: , Rfl:    ALPRAZolam (XANAX) 0.25 MG tablet, Take 0.25 mg by mouth 2 (two) times daily as needed., Disp: , Rfl:    cetirizine (ZYRTEC) 10 MG tablet, Take 10 mg by mouth daily., Disp: , Rfl:    dicyclomine (BENTYL) 10 MG capsule, Take 10 mg by mouth 4 (four) times daily -  before meals and at bedtime., Disp: , Rfl:    gabapentin (NEURONTIN) 100 MG capsule, Take by mouth., Disp: , Rfl:    levothyroxine (SYNTHROID) 300 MCG tablet, Take 1 tablet (300 mcg total) by mouth daily before breakfast. Take with tablet., Disp: 90 tablet, Rfl: 1   melatonin 5 MG TABS, Take by mouth., Disp: , Rfl:    meloxicam (MOBIC) 15 MG tablet, Take 1 tablet by mouth daily., Disp: , Rfl:    MULTIPLE VITAMIN PO, Take by  mouth., Disp: , Rfl:    norethindrone (AYGESTIN) 5 MG tablet, TAKE ONE TABLET BY MOUTH ONE TIME DAILY, Disp: 90 tablet, Rfl: 0   ondansetron (ZOFRAN-ODT) 4 MG disintegrating tablet, Take 1 tablet (4 mg total) by mouth every 8 (eight) hours as needed for nausea or vomiting., Disp: 20 tablet, Rfl: 0   sertraline (ZOLOFT) 100 MG tablet, TAKE ONE TABLET BY MOUTH ONE TIME DAILY, Disp: 90 tablet, Rfl: 1   EPINEPHRINE 0.3 mg/0.3 mL IJ SOAJ injection, INJECT ONE PEN INTO THE THIGH OR AS DIRECTED. AFTER ADMINISTRATION CALL 911. IF ANAPHYLACTIC SYMPTOMS PERSIST AFTER FIRST DOSE, MAY REPEAT DOSE IN 5 TO 15 MINUTES. (Patient not taking: Reported on 12/06/2023), Disp: 2 mL, Rfl: 0   furosemide (LASIX) 20 MG tablet, Take 40 mg by mouth 2 (two) times daily., Disp: , Rfl:    pantoprazole (PROTONIX) 40 MG tablet, Take 1 tablet by mouth daily., Disp: , Rfl:   Allergies:  Allergies  Allergen Reactions   Amitriptyline Swelling and Hypertension    Generalized body swelling, but not throat, face or lips. Could not wake up.   Bee Venom Anaphylaxis   Fluarix Quadrivalent [Influenza Vac Split Quad] Other (See Comments)    Autoimmune response  Ivp Dye [Iodinated Contrast Media] Other (See Comments)    Severe migraine. Patient stated,"this was at [facility]. I now have to be pre-medicated before any Imaging studies."   Bisoprolol-Hydrochlorothiazide Swelling    Generalized swelling but not the face or throat   Diclofenac Sodium Swelling    Generalized swelling but not the face or throat.   Latex Rash    Past Medical History, Surgical history, Social history, and Family History were reviewed and updated.  Review of Systems: Review of Systems  Constitutional:  Positive for fatigue.  HENT:   Negative for nosebleeds.   Respiratory:  Negative for shortness of breath.   Gastrointestinal:  Negative for blood in stool.  Genitourinary:  Negative for hematuria.   Neurological:  Negative for dizziness.     Physical  Exam:  height is 5\' 5"  (1.651 m) and weight is 243 lb (110.2 kg). Her oral temperature is 99.1 F (37.3 C). Her blood pressure is 129/90 (abnormal) and her pulse is 85. Her respiration is 18 and oxygen saturation is 100%.   Physical Exam Vitals and nursing note reviewed.  Constitutional:      General: She is not in acute distress.    Appearance: Normal appearance. She is obese. She is not ill-appearing, toxic-appearing or diaphoretic.  Cardiovascular:     Rate and Rhythm: Normal rate and regular rhythm.     Heart sounds: Normal heart sounds.  Pulmonary:     Effort: Pulmonary effort is normal.     Breath sounds: Normal breath sounds.  Skin:    General: Skin is warm.     Coloration: Skin is not pale.  Neurological:     General: No focal deficit present.     Mental Status: She is alert and oriented to person, place, and time.    Lab Results  Component Value Date   WBC 6.7 12/06/2023   HGB 14.6 12/06/2023   HCT 42.4 12/06/2023   MCV 96.6 12/06/2023   PLT 321 12/06/2023     Chemistry      Component Value Date/Time   NA 140 10/08/2023 0848   K 4.3 10/08/2023 0848   CL 105 10/08/2023 0848   CO2 27 10/08/2023 0848   BUN 11 10/08/2023 0848   CREATININE 0.89 10/08/2023 0848   CREATININE 0.82 07/04/2023 1428      Component Value Date/Time   CALCIUM 9.0 10/08/2023 0848   ALKPHOS 53 10/08/2023 0848   AST 18 10/08/2023 0848   ALT 33 10/08/2023 0848   BILITOT 0.2 (L) 10/08/2023 0848     Encounter Diagnosis  Name Primary?   Iron deficiency anemia, unspecified iron deficiency anemia type Yes   Impression and Plan:  Heather Krause is a 37 y.o. female with IDA: S/p GI work up without cause. Has also had cardiology work up. May be an inflammatory component given her other conditions.   She is tolerating PRN Iv iron well.  Hgb today is 14.6. Iron studied pending. Will supplement with IV iron if needed.   Disposition: RTC 1 labs (CBC, iron, ferritin) RTC 2 months APP, labs  (CBC, iron, ferritin) -[place]   Clent Jacks PA-C 12/12/20249:21 AM

## 2023-12-11 ENCOUNTER — Ambulatory Visit: Payer: No Typology Code available for payment source | Admitting: Physical Therapy

## 2023-12-11 ENCOUNTER — Ambulatory Visit: Payer: Self-pay | Admitting: Family Medicine
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
## 2023-12-13 ENCOUNTER — Encounter: Payer: Self-pay | Admitting: Physical Therapy

## 2023-12-13 ENCOUNTER — Ambulatory Visit: Payer: No Typology Code available for payment source | Admitting: Physical Therapy

## 2023-12-13 DIAGNOSIS — R29898 Other symptoms and signs involving the musculoskeletal system: Secondary | ICD-10-CM

## 2023-12-13 DIAGNOSIS — M5459 Other low back pain: Secondary | ICD-10-CM

## 2023-12-18 ENCOUNTER — Ambulatory Visit: Payer: No Typology Code available for payment source

## 2023-12-18 DIAGNOSIS — M5459 Other low back pain: Secondary | ICD-10-CM

## 2023-12-18 DIAGNOSIS — R29898 Other symptoms and signs involving the musculoskeletal system: Secondary | ICD-10-CM
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
## 2023-12-20 ENCOUNTER — Ambulatory Visit: Payer: No Typology Code available for payment source

## 2023-12-20 DIAGNOSIS — M5459 Other low back pain: Secondary | ICD-10-CM

## 2023-12-20 DIAGNOSIS — R29898 Other symptoms and signs involving the musculoskeletal system: Secondary | ICD-10-CM
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
## 2023-12-25 ENCOUNTER — Encounter: Payer: Self-pay | Admitting: Physical Therapy

## 2023-12-25 ENCOUNTER — Ambulatory Visit: Payer: No Typology Code available for payment source | Admitting: Physical Therapy

## 2023-12-25 DIAGNOSIS — R29898 Other symptoms and signs involving the musculoskeletal system: Secondary | ICD-10-CM

## 2023-12-25 DIAGNOSIS — M5459 Other low back pain: Secondary | ICD-10-CM
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
## 2023-12-27 ENCOUNTER — Encounter: Payer: Self-pay | Admitting: Physical Therapy

## 2023-12-27 ENCOUNTER — Ambulatory Visit: Payer: No Typology Code available for payment source | Attending: Family Medicine | Admitting: Physical Therapy

## 2023-12-27 DIAGNOSIS — M5459 Other low back pain: Secondary | ICD-10-CM | POA: Insufficient documentation

## 2023-12-27 DIAGNOSIS — R29898 Other symptoms and signs involving the musculoskeletal system: Secondary | ICD-10-CM | POA: Diagnosis present

## 2024-01-07 DIAGNOSIS — M7732 Calcaneal spur, left foot: Secondary | ICD-10-CM | POA: Insufficient documentation

## 2024-01-11 DIAGNOSIS — M47816 Spondylosis without myelopathy or radiculopathy, lumbar region: Secondary | ICD-10-CM | POA: Insufficient documentation

## 2024-01-14 ENCOUNTER — Inpatient Hospital Stay: Payer: No Typology Code available for payment source | Attending: Hematology & Oncology

## 2024-01-14 DIAGNOSIS — D509 Iron deficiency anemia, unspecified: Secondary | ICD-10-CM | POA: Insufficient documentation

## 2024-01-14 LAB — FERRITIN: Ferritin: 69 ng/mL (ref 11–307)

## 2024-01-14 LAB — RETIC PANEL
Immature Retic Fract: 11.4 % (ref 2.3–15.9)
RBC.: 4.01 MIL/uL (ref 3.87–5.11)
Retic Count, Absolute: 83 10*3/uL (ref 19.0–186.0)
Retic Ct Pct: 2.1 % (ref 0.4–3.1)
Reticulocyte Hemoglobin: 36.2 pg (ref >27.9)

## 2024-01-14 LAB — CBC
HCT: 39 % (ref 36.0–46.0)
Hemoglobin: 13.5 g/dL (ref 12.0–15.0)
MCH: 33.6 pg (ref 26.0–34.0)
MCHC: 34.6 g/dL (ref 30.0–36.0)
MCV: 97 fL (ref 80.0–100.0)
Platelets: 290 10*3/uL (ref 150–400)
RBC: 4.02 MIL/uL (ref 3.87–5.11)
RDW: 12.6 % (ref 11.5–15.5)
WBC: 5.3 10*3/uL (ref 4.0–10.5)
nRBC: 0 % (ref 0.0–0.2)

## 2024-01-14 LAB — IRON AND IRON BINDING CAPACITY (CC-WL,HP ONLY)
Iron: 69 ug/dL (ref 28–170)
Saturation Ratios: 19 % (ref 10.4–31.8)
TIBC: 367 ug/dL (ref 250–450)
UIBC: 298 ug/dL (ref 148–442)

## 2024-01-15 ENCOUNTER — Encounter: Payer: Self-pay | Admitting: Medical Oncology

## 2024-01-26 ENCOUNTER — Other Ambulatory Visit: Payer: Self-pay | Admitting: Family Medicine

## 2024-01-29 DIAGNOSIS — Z982 Presence of cerebrospinal fluid drainage device: Secondary | ICD-10-CM | POA: Insufficient documentation

## 2024-02-06 ENCOUNTER — Ambulatory Visit: Payer: No Typology Code available for payment source | Admitting: Medical Oncology

## 2024-02-06 ENCOUNTER — Inpatient Hospital Stay: Payer: No Typology Code available for payment source

## 2024-02-11 ENCOUNTER — Encounter: Payer: Self-pay | Admitting: Family Medicine

## 2024-02-11 DIAGNOSIS — I10 Essential (primary) hypertension: Secondary | ICD-10-CM

## 2024-02-11 NOTE — Telephone Encounter (Signed)
 New med request. Rx pended for provider review.

## 2024-02-12 MED ORDER — LABETALOL HCL 100 MG PO TABS: 100.0000 mg | ORAL_TABLET | Freq: Once | ORAL | 1 refills | Status: DC

## 2024-02-14 IMAGING — US US SOFT TISSUE HEAD/NECK
1 series · 14 of 25 positions shown · non-contrast
Comparison: None Available.

CLINICAL DATA: Cervical lymphadenopathy since 1919. Follow-up.
Previous thyroidectomy for thyroiditis.

EXAM:
ULTRASOUND OF HEAD/NECK SOFT TISSUES
TECHNIQUE: Ultrasound examination of the head and neck soft tissues was
performed in the area of clinical concern.

[Series 1: us soft tissue head & neck (non-thyroid) · 32 acquisitions, 14 frames shown]
[im 1/32]
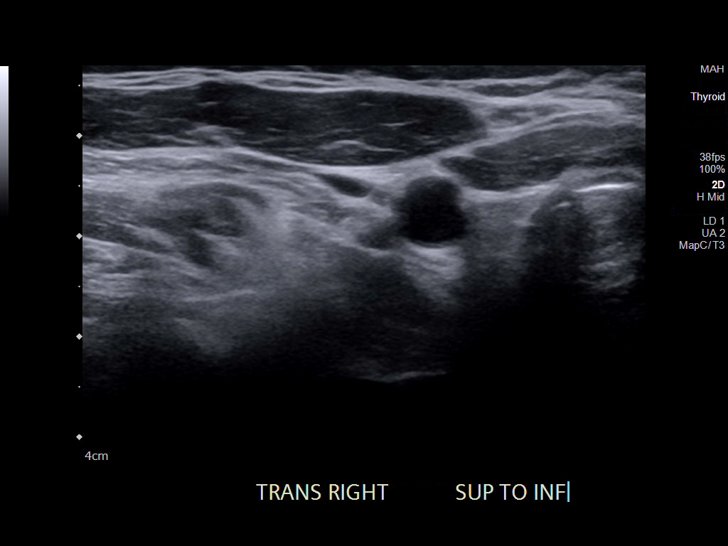
[im 3/32]
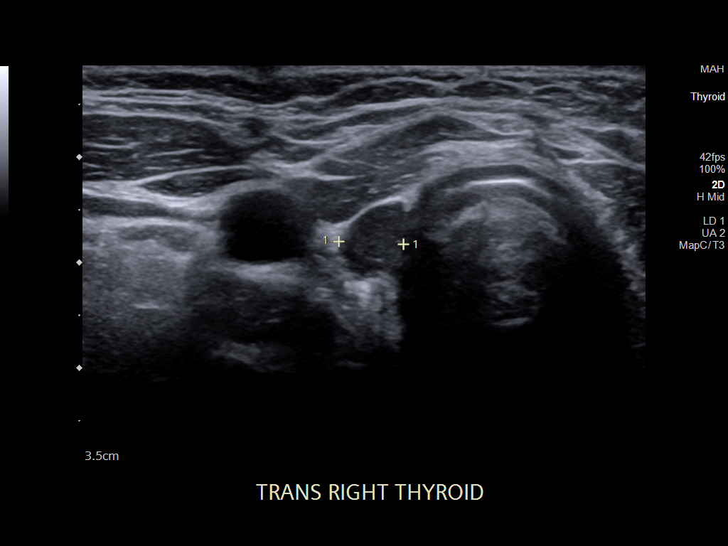
[im 6/32]
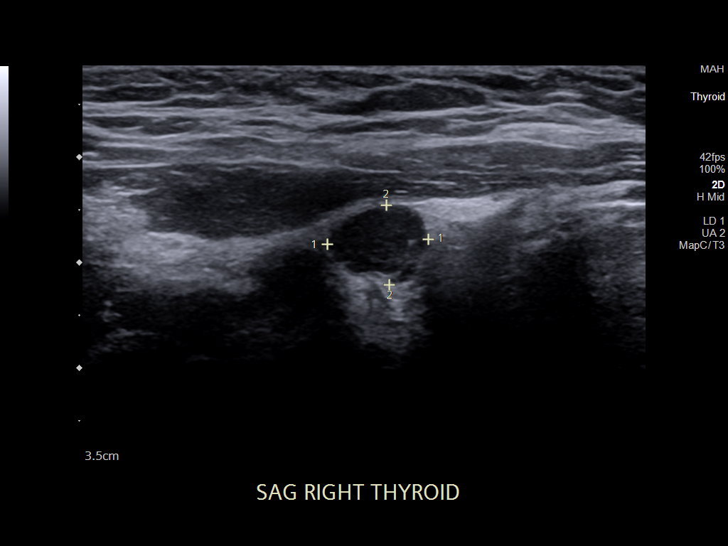
[im 8/32]
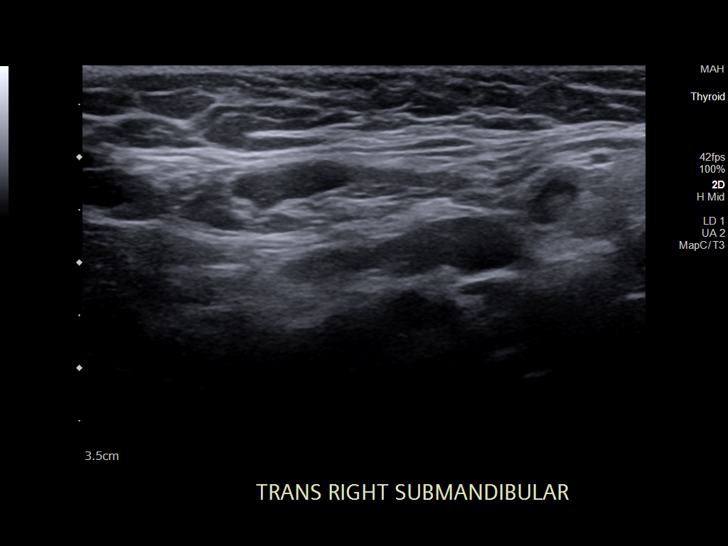
[im 11/32]
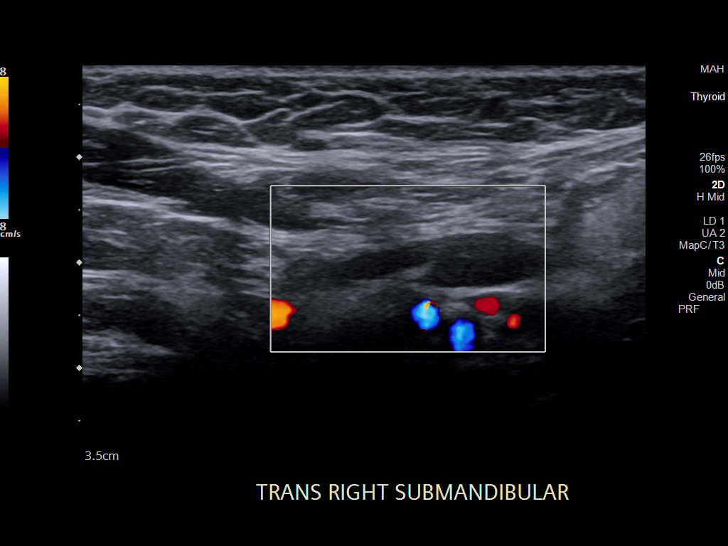
[im 12/32]
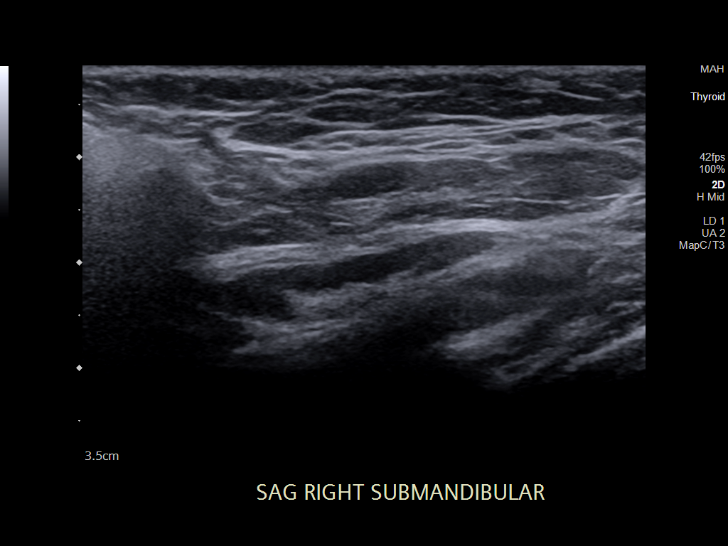
[im 15/32]
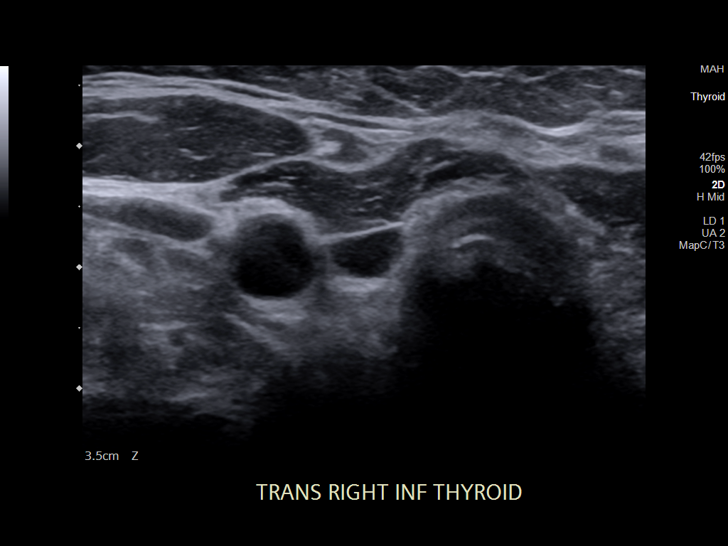
[im 17/32]
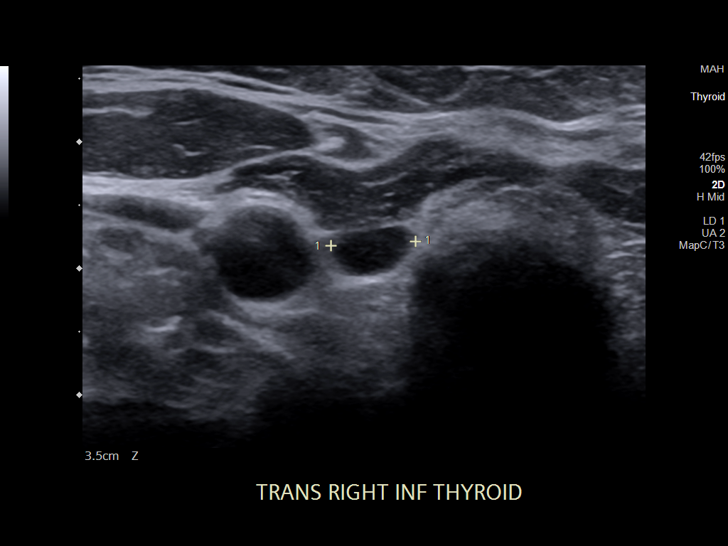
[im 20/32]
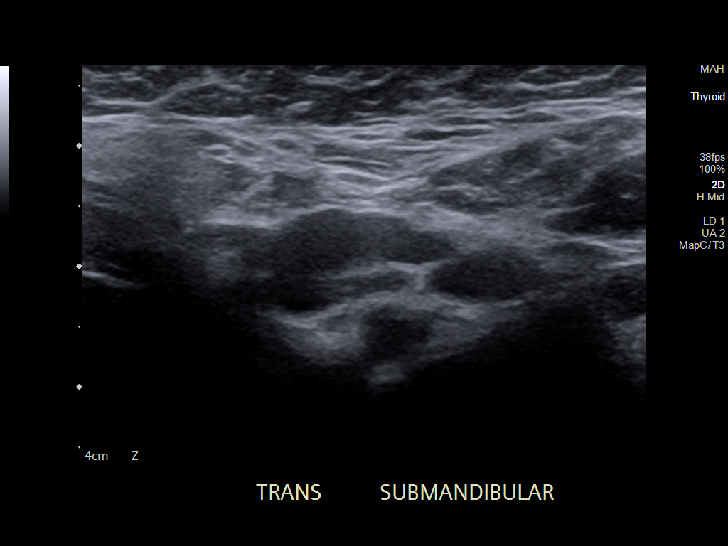
[im 21/32]
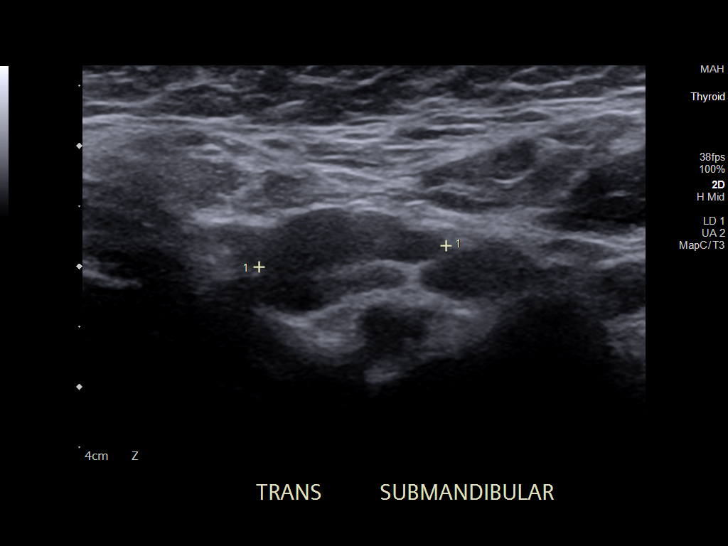
[im 24/32]
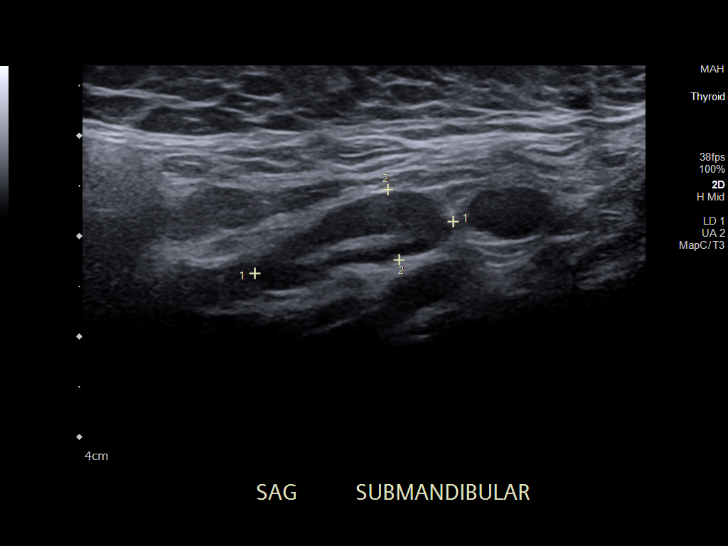
[im 26/32]
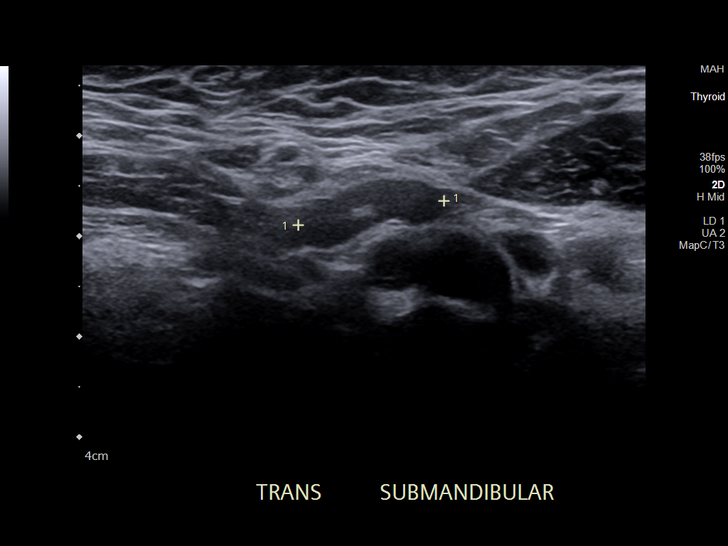
[im 29/32]
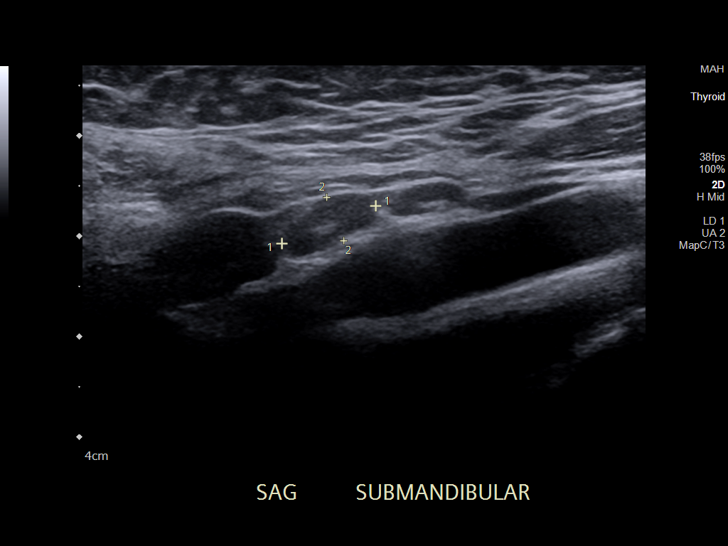
[im 32/32]
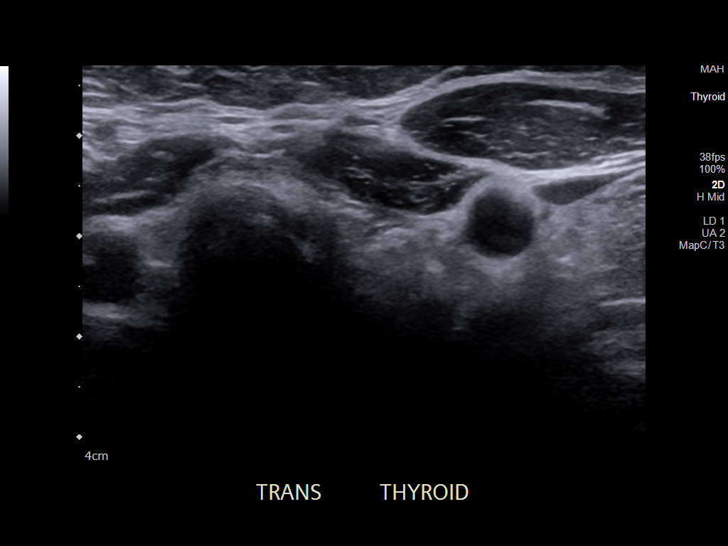

[14 of 25 positions shown; findings below may reference images not displayed]

FINDINGS: Lymph nodes are present in both submandibular/level 1 regions. The
largest on the right measures 2.7 x 0.6 x 1.8 cm, showing normal
fatty hilar morphology. Left submandibular node measures 1.6 x 1.1 x
0.7 cm and similarly shows normal morphology. No hyperemia. These
are probably normal lymph nodes.

The patient has had a thyroidectomy. No tissue seen in the left
thyroid bed. In the right thyroid bed, there is tissue measuring
x 0.4 x 0.7 cm which could represent some residual thyroid tissue.
Lymph node in the thyroid bed is possible.
IMPRESSION: Bilateral level 1 lymph nodes, upper limits of normal in size, with
normal morphology, probably not pathologic.

Previous thyroidectomy. Tissue in the right thyroid bed measuring 1
x 0.4 x 0.7 cm which could represent some residual thyroid tissue or
a small lymph node.

## 2024-03-03 ENCOUNTER — Other Ambulatory Visit: Payer: Self-pay | Admitting: Family Medicine

## 2024-03-05 ENCOUNTER — Encounter: Payer: Self-pay | Admitting: Medical Oncology

## 2024-03-05 ENCOUNTER — Inpatient Hospital Stay: Payer: No Typology Code available for payment source | Attending: Hematology & Oncology

## 2024-03-05 ENCOUNTER — Other Ambulatory Visit: Payer: Self-pay | Admitting: *Deleted

## 2024-03-05 ENCOUNTER — Inpatient Hospital Stay (HOSPITAL_BASED_OUTPATIENT_CLINIC_OR_DEPARTMENT_OTHER): Payer: No Typology Code available for payment source | Admitting: Medical Oncology

## 2024-03-05 VITALS — BP 134/86 | HR 78 | Temp 98.9°F | Resp 18 | Ht 65.0 in

## 2024-03-05 DIAGNOSIS — D509 Iron deficiency anemia, unspecified: Secondary | ICD-10-CM

## 2024-03-05 LAB — CBC WITH DIFFERENTIAL (CANCER CENTER ONLY)
Abs Immature Granulocytes: 0.06 10*3/uL (ref 0.00–0.07)
Basophils Absolute: 0.1 10*3/uL (ref 0.0–0.1)
Basophils Relative: 1 %
Eosinophils Absolute: 0.2 10*3/uL (ref 0.0–0.5)
Eosinophils Relative: 2 %
HCT: 42.4 % (ref 36.0–46.0)
Hemoglobin: 14.5 g/dL (ref 12.0–15.0)
Immature Granulocytes: 1 %
Lymphocytes Relative: 25 %
Lymphs Abs: 2.2 10*3/uL (ref 0.7–4.0)
MCH: 33.2 pg (ref 26.0–34.0)
MCHC: 34.2 g/dL (ref 30.0–36.0)
MCV: 97 fL (ref 80.0–100.0)
Monocytes Absolute: 0.8 10*3/uL (ref 0.1–1.0)
Monocytes Relative: 9 %
Neutro Abs: 5.5 10*3/uL (ref 1.7–7.7)
Neutrophils Relative %: 62 %
Platelet Count: 316 10*3/uL (ref 150–400)
RBC: 4.37 MIL/uL (ref 3.87–5.11)
RDW: 12.6 % (ref 11.5–15.5)
WBC Count: 8.9 10*3/uL (ref 4.0–10.5)
nRBC: 0 % (ref 0.0–0.2)

## 2024-03-05 LAB — IRON AND IRON BINDING CAPACITY (CC-WL,HP ONLY)
Iron: 88 ug/dL (ref 28–170)
Saturation Ratios: 21 % (ref 10.4–31.8)
TIBC: 416 ug/dL (ref 250–450)
UIBC: 328 ug/dL (ref 148–442)

## 2024-03-05 LAB — FERRITIN: Ferritin: 43 ng/mL (ref 11–307)

## 2024-03-05 NOTE — Progress Notes (Signed)
 Hematology and Oncology Follow Up Visit  Heather Krause [id number] 08-May-1986 37 y.o. 03/05/2024  Past Medical History:  Diagnosis Date   Allergy    Latex   Anxiety    Asthma    Depression    GERD (gastroesophageal reflux disease)    Hypertension    Thyroid disease     Principle Diagnosis:  Anemia-   Current Therapy:   IV Iron- Venofer- last dose 07/19/2023    Interim History:  Heather Krause is back for a scheduled follow-up for her anemia.   She continued to work with her neuro team at [facility].. 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 This went well. She reports no headaches since.   She also had an achilles tendon repair on 02/22/2024 at [facility]. This is healing well.   She also is followed by Endocrinology for her hypothyroidism. TSH value is greatly improved as is her fatigue.   She also continues to work with her GI team- [facility].   In terms of her IDA she denies any new bleeding or bruising episodes.  She is tolerating IV iron well when needed. She takes OCP to prevent menstrual cycles.   No SOB, hematuria, hemoptysis, epistaxis, melena.      Wt Readings from Last 3 Encounters:  12/06/23 243 lb (110.2 kg)  11/15/23 246 lb (111.6 kg)  11/15/23 245 lb 3.2 oz (111.2 kg)     Medications:   Current Outpatient Medications:    ALPRAZolam (XANAX) 0.25 MG tablet, Take 0.25 mg by mouth 2 (two) times daily as needed., Disp: , Rfl:    cetirizine (ZYRTEC) 10 MG tablet, Take 10 mg by mouth daily., Disp: , Rfl:    dicyclomine (BENTYL) 10 MG capsule, Take 10 mg by mouth 4 (four) times daily -  before meals and at bedtime., Disp: , Rfl:    levothyroxine (SYNTHROID) 300 MCG tablet, Take 1 tablet (300 mcg total) by mouth daily before breakfast. Take with tablet., Disp: 90 tablet, Rfl: 1   melatonin 5 MG TABS, Take by mouth., Disp: , Rfl:    meloxicam (MOBIC) 15 MG tablet, Take 1 tablet by mouth daily., Disp: , Rfl:    MULTIPLE VITAMIN PO, Take by mouth., Disp: , Rfl:     norethindrone (AYGESTIN) 5 MG tablet, Take 1 tablet by mouth once daily, Disp: 30 tablet, Rfl: 0   ondansetron (ZOFRAN-ODT) 4 MG disintegrating tablet, Take 1 tablet (4 mg total) by mouth every 8 (eight) hours as needed for nausea or vomiting., Disp: 20 tablet, Rfl: 0   sertraline (ZOLOFT) 100 MG tablet, TAKE ONE TABLET BY MOUTH ONE TIME DAILY, Disp: 90 tablet, Rfl: 1   EPINEPHRINE 0.3 mg/0.3 mL IJ SOAJ injection, INJECT ONE PEN INTO THE THIGH OR AS DIRECTED. AFTER ADMINISTRATION CALL 911. IF ANAPHYLACTIC SYMPTOMS PERSIST AFTER FIRST DOSE, MAY REPEAT DOSE IN 5 TO 15 MINUTES. (Patient not taking: Reported on 03/05/2024), Disp: 2 mL, Rfl: 0   furosemide (LASIX) 20 MG tablet, Take 40 mg by mouth 2 (two) times daily., Disp: , Rfl:    labetalol (NORMODYNE) 100 MG tablet, Take 100 mg by mouth daily., Disp: , Rfl:    pantoprazole (PROTONIX) 40 MG tablet, Take 1 tablet by mouth daily., Disp: , Rfl:   Allergies:  Allergies  Allergen Reactions   Amitriptyline Swelling and Hypertension    Generalized body swelling, but not throat, face or lips. Could not wake up.   Bee Venom Anaphylaxis   Fluarix Quadrivalent [Influenza Vac Split Quad]  Other (See Comments)    Autoimmune response   Ivp Dye [Iodinated Contrast Media] Other (See Comments)    Severe migraine. Patient stated,"this was at [facility]. I now have to be pre-medicated before any Imaging studies."   Bisoprolol-Hydrochlorothiazide Swelling    Generalized swelling but not the face or throat   Diclofenac Sodium Swelling    Generalized swelling but not the face or throat.   Latex Rash    Past Medical History, Surgical history, Social history, and Family History were reviewed and updated.  Review of Systems: Review of Systems  Constitutional:  Positive for fatigue.  HENT:   Negative for nosebleeds.   Respiratory:  Negative for shortness of breath.   Gastrointestinal:  Negative for blood in stool.  Genitourinary:  Negative for hematuria.    Neurological:  Negative for dizziness.     Physical Exam:  height is 5\' 5"  (1.651 m). Her oral temperature is 98.9 F (37.2 C). Her blood pressure is 134/86 and her pulse is 78. Her respiration is 18 and oxygen saturation is 100%.   Physical Exam Vitals and nursing note reviewed.  Constitutional:      General: She is not in acute distress.    Appearance: Normal appearance. She is obese. She is not ill-appearing, toxic-appearing or diaphoretic.  Cardiovascular:     Rate and Rhythm: Normal rate and regular rhythm.     Heart sounds: Normal heart sounds.  Pulmonary:     Effort: Pulmonary effort is normal.     Breath sounds: Normal breath sounds.  Skin:    General: Skin is warm.     Coloration: Skin is not pale.  Neurological:     General: No focal deficit present.     Mental Status: She is alert and oriented to person, place, and time.    Lab Results  Component Value Date   WBC 8.9 03/05/2024   HGB 14.5 03/05/2024   HCT 42.4 03/05/2024   MCV 97.0 03/05/2024   PLT 316 03/05/2024     Chemistry      Component Value Date/Time   NA 140 10/08/2023 0848   K 4.3 10/08/2023 0848   CL 105 10/08/2023 0848   CO2 27 10/08/2023 0848   BUN 11 10/08/2023 0848   CREATININE 0.89 10/08/2023 0848   CREATININE 0.82 07/04/2023 1428      Component Value Date/Time   CALCIUM 9.0 10/08/2023 0848   ALKPHOS 53 10/08/2023 0848   AST 18 10/08/2023 0848   ALT 33 10/08/2023 0848   BILITOT 0.2 (L) 10/08/2023 0848     Encounter Diagnosis  Name Primary?   Iron deficiency anemia, unspecified iron deficiency anemia type Yes    Impression and Plan:  Heather Krause is a 38 y.o. female with IDA of unknown origin. She has had work up with GI, cardiology, etc. Recently levels have been stable.   She is tolerating PRN IV iron well.  Hgb today is 14.5 Iron studied pending. Will supplement with IV iron if needed.   Disposition:  RTC 3 months APP, labs (CBC, iron, ferritin) -[place]   Clent Jacks PA-C 3/12/202510:13 AM

## 2024-03-06 ENCOUNTER — Encounter: Payer: Self-pay | Admitting: Medical Oncology

## 2024-03-12 ENCOUNTER — Ambulatory Visit: Attending: Podiatry | Admitting: Physical Therapy

## 2024-03-12 ENCOUNTER — Encounter: Payer: Self-pay | Admitting: Physical Therapy

## 2024-03-12 ENCOUNTER — Other Ambulatory Visit: Payer: Self-pay

## 2024-03-12 DIAGNOSIS — R262 Difficulty in walking, not elsewhere classified: Secondary | ICD-10-CM | POA: Diagnosis present

## 2024-03-12 DIAGNOSIS — M5459 Other low back pain: Secondary | ICD-10-CM | POA: Diagnosis present

## 2024-03-12 DIAGNOSIS — M25572 Pain in left ankle and joints of left foot: Secondary | ICD-10-CM

## 2024-03-12 DIAGNOSIS — M6281 Muscle weakness (generalized): Secondary | ICD-10-CM | POA: Diagnosis present

## 2024-03-12 DIAGNOSIS — R29898 Other symptoms and signs involving the musculoskeletal system: Secondary | ICD-10-CM | POA: Insufficient documentation

## 2024-03-12 NOTE — Therapy (Signed)
 OUTPATIENT PHYSICAL THERAPY LOWER EXTREMITY EVALUATION   Patient Name: Heather Krause MRN: [id number] DOB:1986-08-13, 38 y.o., female Today's Date: 03/12/2024  END OF SESSION:  PT End of Session - 03/12/24 1327     Visit Number 1    Number of Visits 16    Date for PT Re-Evaluation 05/07/24    Authorization Type UHC    PT Start Time 1145    PT Stop Time 1221    PT Time Calculation (min) 36 min    Activity Tolerance Patient tolerated treatment well    Behavior During Therapy WFL for tasks assessed/performed             Past Medical History:  Diagnosis Date   Allergy    Latex   Anxiety    Asthma    Depression    GERD (gastroesophageal reflux disease)    Hypertension    Thyroid disease    Past Surgical History:  Procedure Laterality Date   CHOLECYSTECTOMY     ESOPHAGUS SURGERY     Patient Active Problem List   Diagnosis Date Noted   Spinal stenosis of lumbar region without neurogenic claudication 11/15/2023   Achilles tendonitis 08/15/2023   DJD of left AC (acromioclavicular) joint 06/28/2023   IDA (iron deficiency anemia) 02/27/2023   Tachycardia 02/22/2023   AC joint arthropathy 12/06/2022   Increased frequency of urination 08/22/2022   Lower extremity edema 04/24/2022   PTSD (post-traumatic stress disorder) 03/18/2021   Allergic rhinitis 01/06/2021   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   Hypothyroidism due to Hashimoto's thyroiditis 06/15/2016   Gastroesophageal reflux disease without esophagitis 09/01/2014   Hypertension 10/22/2008   Migraines 05/27/2008    PCP: [name]  REFERRING PROVIDER: [name]  REFERRING DIAG: Lt achilles tendonitis, Lt foot surgery  THERAPY DIAG:  Difficulty in walking, not elsewhere classified  Muscle weakness (generalized)  Pain in left ankle and joints of left foot  Rationale for Evaluation and Treatment: Rehabilitation  ONSET DATE: 02/22/24  SUBJECTIVE:   SUBJECTIVE STATEMENT: Pt is s/p Lt excision heel  spur with achilles tendon repair on 02/22/24. She was initially NWB for 2 weeks and has been PWB with RW for the past week. She reports she feels burning pain in the ball of her foot usually later in the day after she has been active. She reports she continues to have swelling in Lt foot and ankle, especially when the foot has not been elevated for a while. She has been gaining independence with ADLs and IADLs. 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 Yes: NPRS scale: 2/10 currently 9/10 at worst Pain location: Lt foot/ankle Pain description: burning Aggravating factors: prolonged walking or standing, dependent position Relieving factors: rest, elevation, meds  PRECAUTIONS: None  RED FLAGS: None   WEIGHT BEARING RESTRICTIONS:  protected weight bearing with AD - per protocol  FALLS:  Has patient fallen in last 6 months? 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   PATIENT SURVEYS:  LEFS 25/80  COGNITION: Overall cognitive status: Within functional limits for tasks assessed     EDEMA:  Circumferential: 27 cm  on Lt 26.5cm Rt    PALPATION: Lt toes blue, cold to touch - pt states MD aware and she has been cleared by vascular Mild TTP around incision Incision closed  LOWER EXTREMITY MMT Not assessed due to protocol limitations  LOWER EXTREMITY ROM:  AROM Right eval Left eval  Hip flexion    Hip extension     Hip abduction    Hip adduction    Hip internal rotation    Hip external rotation    Knee flexion    Knee extension    Ankle dorsiflexion  -5  Ankle plantarflexion  45  Ankle inversion  17  Ankle eversion  15   (Blank rows = not tested)   GAIT: Distance walked: 100' Assistive device utilized: Environmental consultant - 2 wheeled Level of assistance: Modified independence Comments: step to pattern, Lt walking boot                                                                                                                                TREATMENT DATE: 03/12/24 See HEP Scar massage education PT educated pt on PT POC and goals, protocol, rationale for treatment    PATIENT EDUCATION:  Education details: PT POC and goals, HEP, protocol Person educated: Patient Education method: Explanation, Demonstration, and Handouts Education comprehension: verbalized understanding and returned demonstration  HOME EXERCISE PROGRAM: Access Code: [email or web address] URL: [email or web address] Date: 03/12/2024 Prepared by: Reggy Eye  Exercises - Supine Ankle Dorsiflexion and Plantarflexion AROM  - 3 x daily - 7 x weekly - 3 sets - 10 reps - Supine Ankle Inversion and Eversion AROM  - 3 x daily - 7 x weekly - 3 sets - 10 reps - Supine Active Straight Leg Raise  - 1 x daily - 7 x weekly - 3 sets - 10 reps - Prone Hip Extension  - 1 x daily - 7 x weekly - 3 sets - 10 reps - Sidelying Hip Abduction  - 1 x daily - 7 x weekly - 3 sets - 10 reps  Patient Education - Scar Massage  ASSESSMENT:  CLINICAL IMPRESSION: Patient is a 38 y.o. female who was seen today for physical therapy evaluation and treatment for s/p Lt excision heel spur with achilles tendon repair. She presents with impaired gait and balance, decreased ROM and strength and decreased functional activity tolerance. Pt will benefit from skilled PT to address deficits and improve functional mobility.   OBJECTIVE IMPAIRMENTS: Abnormal  gait, decreased activity tolerance, decreased balance, difficulty walking, decreased ROM, decreased strength, increased edema, and pain.   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   REHAB POTENTIAL: Good  CLINICAL DECISION MAKING: Evolving/moderate complexity  EVALUATION COMPLEXITY: Moderate   GOALS: Goals reviewed with patient? Yes  SHORT TERM GOALS: Target date: 04/09/2024   Pt will be independent with initial HEP Baseline: Goal status: INITIAL  2.  Pt will perform gait with LRAD x 10 minutes with pain <= 2/10 Baseline:  Goal status: INITIAL    LONG TERM GOALS: Target date: 05/07/2024    Pt will be independent with advanced HEP Baseline:  Goal status: INITIAL  2.  Pt will improve LEFS to >= 34 to demo improved functional mobility Baseline:  Goal status: INITIAL  3.  Pt will perform gait on a variety of surfaces x 10 minutes without use of AD with pain <= 2/10 Baseline:  Goal status: INITIAL  4.  Pt will demo Lt ankle strength 4+/5 to improve functional activity tolerance Baseline:  Goal status: INITIAL  5.  Pt will demo Lt ankle DF to 8 degrees to improve gait mechanics Baseline:  Goal status: INITIAL   PLAN:  PT FREQUENCY: 2x/week  PT DURATION: 8 weeks  PLANNED INTERVENTIONS: 97164- PT Re-evaluation, 97110-Therapeutic exercises, 97530- Therapeutic activity, 97112- Neuromuscular re-education, 97535- Self Care, 16109- Manual therapy, [id number]- Aquatic Therapy, U0454- Electrical stimulation (unattended), 97016- Vasopneumatic device, 97033- Ionotophoresis 4mg /ml Dexamethasone, Patient/Family education, Balance training, Stair training, Taping, Dry Needling, DME instructions, Cryotherapy, and Moist  heat  PLAN FOR NEXT SESSION: progress per protocol - review scar massage   Gabriana Wilmott, PT 03/12/2024, 1:28 PM

## 2024-03-18 ENCOUNTER — Ambulatory Visit

## 2024-03-18 DIAGNOSIS — R262 Difficulty in walking, not elsewhere classified: Secondary | ICD-10-CM

## 2024-03-18 DIAGNOSIS — M25572 Pain in left ankle and joints of left foot: Secondary | ICD-10-CM

## 2024-03-18 DIAGNOSIS — M6281 Muscle weakness (generalized): Secondary | ICD-10-CM

## 2024-03-18 NOTE — Therapy (Signed)
 OUTPATIENT PHYSICAL THERAPY LOWER EXTREMITY TREATMENT   Patient Name: Heather Krause MRN: [id number] DOB:1986-10-31, 38 y.o., female Today's Date: 03/18/2024  END OF SESSION:  PT End of Session - 03/18/24 0801     Visit Number 2    Number of Visits 16    Date for PT Re-Evaluation 05/07/24    Authorization Type UHC    Progress Note Due on Visit 10    PT Start Time 0800    PT Stop Time 0848    PT Time Calculation (min) 48 min    Activity Tolerance Patient tolerated treatment well    Behavior During Therapy [facility] for tasks assessed/performed             Past Medical History:  Diagnosis Date   Allergy    Latex   Anxiety    Asthma    Depression    GERD (gastroesophageal reflux disease)    Hypertension    Thyroid disease    Past Surgical History:  Procedure Laterality Date   CHOLECYSTECTOMY     ESOPHAGUS SURGERY     Patient Active Problem List   Diagnosis Date Noted   Spinal stenosis of lumbar region without neurogenic claudication 11/15/2023   Achilles tendonitis 08/15/2023   DJD of left AC (acromioclavicular) joint 06/28/2023   IDA (iron deficiency anemia) 02/27/2023   Tachycardia 02/22/2023   AC joint arthropathy 12/06/2022   Increased frequency of urination 08/22/2022   Lower extremity edema 04/24/2022   PTSD (post-traumatic stress disorder) 03/18/2021   Allergic rhinitis 01/06/2021   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   Hypothyroidism due to Hashimoto's thyroiditis 06/15/2016   Gastroesophageal reflux disease without esophagitis 09/01/2014   Hypertension 10/22/2008   Migraines 05/27/2008    PCP: [name]  REFERRING PROVIDER: [name]  REFERRING DIAG: Lt achilles tendonitis, Lt foot surgery  THERAPY DIAG:  Difficulty in walking, not elsewhere classified  Muscle weakness (generalized)  Pain in left ankle and joints of left foot  Rationale for Evaluation and Treatment: Rehabilitation  ONSET DATE: 02/22/24  SUBJECTIVE:   SUBJECTIVE  STATEMENT: Patient reports she has had increased swelling since last PT visit; states she has been elevating the foot which has helped. Patient states she added back in a heel riser (two total in boot).   EVAL: Pt is s/p Lt excision heel spur with achilles tendon repair on 02/22/24. She was initially NWB for 2 weeks and has been PWB with RW for the past week. She reports she feels burning pain in the ball of her foot usually later in the day after she has been active. She reports she continues to have swelling in Lt foot and ankle, especially when the foot has not been elevated for a while. She has been gaining independence with ADLs and IADLs. 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 Yes: NPRS scale: 2/10 currently 9/10 at worst Pain location: Lt foot/ankle Pain description: burning Aggravating factors: prolonged walking or standing, dependent position Relieving factors: rest, elevation, meds  PRECAUTIONS: None  RED FLAGS: None   WEIGHT BEARING RESTRICTIONS:  protected weight bearing with AD - per protocol  FALLS:  Has patient fallen in last 6 months? 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   PATIENT SURVEYS:  LEFS 25/80  COGNITION: Overall cognitive status: Within functional limits for tasks assessed     EDEMA:  Circumferential: 27 cm  on Lt 26.5cm Rt    PALPATION: Lt toes blue, cold to touch - pt states MD  aware and she has been cleared by vascular Mild TTP around incision Incision closed  LOWER EXTREMITY MMT Not assessed due to protocol limitations  LOWER EXTREMITY ROM:  AROM Right eval Left eval  Hip flexion    Hip extension    Hip abduction    Hip adduction    Hip internal rotation    Hip external rotation    Knee flexion    Knee extension    Ankle dorsiflexion  -5  Ankle plantarflexion  45  Ankle inversion  17  Ankle eversion  15   (Blank rows = not tested)   GAIT: Distance walked: 100' Assistive device utilized: Environmental consultant - 2 wheeled Level of assistance: Modified independence Comments: step to pattern, Lt walking boot   [facility] Adult PT Treatment:                                                DATE: 03/18/2024 Therapeutic Exercise: Ankle AROM (leg propped on wedge) --> ever/inver, df/pf Small range SLR 3x10 S/L straight leg hip abduction 3x10 Prone knee flexion 3x10 Prone hip extension 3x10 Manual Therapy: IASTM (roller stick) HS, gastroc, quads Modalities: Vaso (L) ankle, 34 degrees, light compression x10 min                                                                                                                               TREATMENT DATE: 03/12/24 See HEP Scar massage education PT educated pt on PT POC and goals, protocol, rationale for treatment    PATIENT EDUCATION:  Education details: Updated HEP Person educated: Patient Education method: Explanation, Demonstration, and Handouts Education comprehension: verbalized understanding and returned demonstration  HOME EXERCISE PROGRAM: Access Code: [email or web address] URL: [email or web address] Date: 03/18/2024 Prepared by: Carlynn Herald  Exercises - Supine Ankle Dorsiflexion and Plantarflexion AROM  - 3 x daily - 7 x weekly - 3 sets - 10 reps - Supine Ankle Inversion and Eversion AROM  - 3 x daily - 7 x weekly - 3 sets - 10 reps - Small Range Straight Leg Raise  - 1 x daily - 7 x weekly - 3  sets - 10 reps - Sidelying Hip Abduction  - 1 x daily - 7 x weekly - 3 sets - 10  reps - Prone Hip Extension  - 1 x daily - 7 x weekly - 3 sets - 10 reps - Prone Knee Flexion  - 1 x daily - 7 x weekly - 3 sets - 10 reps  Patient Education - Scar Massage  ASSESSMENT:  CLINICAL IMPRESSION: Ankle mobility and hip strengthening exercises continued per protocol. Manual intervention provided to address myofascial tension along anterior/posterior L LE. HEP updated with prone knee flexion with neutral ankle. Will review scar massage once incision has fully healed and patient is able to tolerate palpitation along incision site.   EVAL: Patient is a 38 y.o. female who was seen today for physical therapy evaluation and treatment for s/p Lt excision heel spur with achilles tendon repair. She presents with impaired gait and balance, decreased ROM and strength and decreased functional activity tolerance. Pt will benefit from skilled PT to address deficits and improve functional mobility.   OBJECTIVE IMPAIRMENTS: Abnormal gait, decreased activity tolerance, decreased balance, difficulty walking, decreased ROM, decreased strength, increased edema, and pain.   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   REHAB POTENTIAL: Good  CLINICAL DECISION MAKING: Evolving/moderate complexity  EVALUATION COMPLEXITY: Moderate   GOALS: Goals reviewed with patient? Yes  SHORT TERM GOALS: Target date: 04/09/2024  Pt will be independent with initial HEP Baseline: Goal status: INITIAL  2.  Pt will perform gait with LRAD x 10 minutes with pain <= 2/10 Baseline:  Goal status: INITIAL    LONG TERM GOALS: Target date: 05/07/2024  Pt will be  independent with advanced HEP Baseline:  Goal status: INITIAL  2.  Pt will improve LEFS to >= 34 to demo improved functional mobility Baseline:  Goal status: INITIAL  3.  Pt will perform gait on a variety of surfaces x 10 minutes without use of AD with pain <= 2/10 Baseline:  Goal status: INITIAL  4.  Pt will demo Lt ankle strength 4+/5 to improve functional activity tolerance Baseline:  Goal status: INITIAL  5.  Pt will demo Lt ankle DF to 8 degrees to improve gait mechanics Baseline:  Goal status: INITIAL   PLAN:  PT FREQUENCY: 2x/week  PT DURATION: 8 weeks  PLANNED INTERVENTIONS: 97164- PT Re-evaluation, 97110-Therapeutic exercises, 97530- Therapeutic activity, 97112- Neuromuscular re-education, 97535- Self Care, 16109- Manual therapy, [phone]- Aquatic Therapy, [id number]- Electrical stimulation (unattended), 97016- Vasopneumatic device, 97033- Ionotophoresis 4mg /ml Dexamethasone, Patient/Family education, Balance training, Stair training, Taping, Dry Needling, DME instructions, Cryotherapy, and Moist heat  PLAN FOR NEXT SESSION: progress per protocol - review scar massage   Sanjuana Mae, PTA 03/18/2024, 8:44 AM

## 2024-03-20 ENCOUNTER — Ambulatory Visit

## 2024-03-21 ENCOUNTER — Ambulatory Visit

## 2024-03-21 DIAGNOSIS — M25572 Pain in left ankle and joints of left foot: Secondary | ICD-10-CM

## 2024-03-21 DIAGNOSIS — R262 Difficulty in walking, not elsewhere classified: Secondary | ICD-10-CM | POA: Diagnosis not present

## 2024-03-21 DIAGNOSIS — R29898 Other symptoms and signs involving the musculoskeletal system: Secondary | ICD-10-CM

## 2024-03-21 DIAGNOSIS — M5459 Other low back pain: Secondary | ICD-10-CM

## 2024-03-21 DIAGNOSIS — M6281 Muscle weakness (generalized): Secondary | ICD-10-CM

## 2024-03-21 NOTE — Therapy (Signed)
 OUTPATIENT PHYSICAL THERAPY LOWER EXTREMITY TREATMENT   Patient Name: Heather Krause MRN: [id number] DOB:1986/07/30, 38 y.o., female Today's Date: 03/21/2024  END OF SESSION:  PT End of Session - 03/21/24 1102     Visit Number 3    Number of Visits 16    Date for PT Re-Evaluation 05/07/24    Authorization Type [employer]    Progress Note Due on Visit 10    PT Start Time 1102    PT Stop Time 1152    PT Time Calculation (min) 50 min    Activity Tolerance Patient tolerated treatment well    Behavior During Therapy WFL for tasks assessed/performed             Past Medical History:  Diagnosis Date   Allergy    Latex   Anxiety    Asthma    Depression    GERD (gastroesophageal reflux disease)    Hypertension    Thyroid disease    Past Surgical History:  Procedure Laterality Date   CHOLECYSTECTOMY     ESOPHAGUS SURGERY     Patient Active Problem List   Diagnosis Date Noted   Spinal stenosis of lumbar region without neurogenic claudication 11/15/2023   Achilles tendonitis 08/15/2023   DJD of left AC (acromioclavicular) joint 06/28/2023   IDA (iron deficiency anemia) 02/27/2023   Tachycardia 02/22/2023   AC joint arthropathy 12/06/2022   Increased frequency of urination 08/22/2022   Lower extremity edema 04/24/2022   PTSD (post-traumatic stress disorder) 03/18/2021   Allergic rhinitis 01/06/2021   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   Hypothyroidism due to Hashimoto's thyroiditis 06/15/2016   Gastroesophageal reflux disease without esophagitis 09/01/2014   Hypertension 10/22/2008   Migraines 05/27/2008    PCP: [name]  REFERRING PROVIDER: [name]  REFERRING DIAG: Lt achilles tendonitis, Lt foot surgery  THERAPY DIAG:  Difficulty in walking, not elsewhere classified  Muscle weakness (generalized)  Pain in left ankle and joints of left foot  Other low back pain  Other symptoms and signs involving the musculoskeletal system  Rationale for Evaluation  and Treatment: Rehabilitation  ONSET DATE: 02/22/24  SUBJECTIVE:   SUBJECTIVE STATEMENT: Patient reports the bottom of her foot very sensitive at the heel after back injection; states she got an ice machine for ankle. Patient states she still has two levels on heel lift; states she has not noticed "burning pain" in ball of foot.     EVAL: Pt is s/p Lt excision heel spur with achilles tendon repair on 02/22/24. She was initially NWB for 2 weeks and has been PWB with RW for the past week. She reports she feels burning pain in the ball of her foot usually later in the day after she has been active. She reports she continues to have swelling in Lt foot and ankle, especially when the foot has not been elevated for a while. She has been gaining independence with ADLs and IADLs. 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 Yes: NPRS scale: 2/10 currently 9/10 at worst Pain location: Lt foot/ankle Pain description: burning Aggravating factors: prolonged walking or standing, dependent position Relieving factors: rest, elevation, meds  PRECAUTIONS: None  RED FLAGS: None   WEIGHT BEARING RESTRICTIONS:  protected weight bearing with AD - per protocol  FALLS:  Has patient fallen in last 6 months? 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   PATIENT SURVEYS:  LEFS 25/80  COGNITION: Overall cognitive status: Within functional limits for  tasks assessed     EDEMA:  Circumferential: 27 cm  on Lt 26.5cm Rt    PALPATION: Lt toes blue, cold to touch - pt states MD aware and she has been cleared by vascular Mild TTP around incision Incision closed  LOWER EXTREMITY MMT Not assessed due to protocol limitations  LOWER EXTREMITY ROM:  AROM Right eval Left eval  Hip flexion    Hip extension    Hip abduction    Hip adduction    Hip internal rotation    Hip external rotation    Knee flexion    Knee extension    Ankle dorsiflexion  -5  Ankle plantarflexion  45  Ankle inversion  17  Ankle eversion  15   (Blank rows = not tested)   GAIT: Distance walked: 100' Assistive device utilized: Environmental consultant - 2 wheeled Level of assistance: Modified independence Comments: step to pattern, Lt walking boot   [facility] Adult PT Treatment:                                                DATE: 03/21/2024 Therapeutic Exercise: Ankle AROM (leg propped on wedge) --> ever/inver, df/pf Small range SLR 3x10x5" S/L straight leg hip abduction 3x10 Prone knee flexion 3x10 Prone hip extension 3x10 Neuromuscular re-ed: Standing weight shifting --> progressing Wbing as tolerated per protocol Gait: Gait training with RW --> increasing Wbing as tolerated Modalities: Vaso (L) ankle, 34 degrees, light compression x10 min   [facility] Adult PT Treatment:                                                DATE: 03/18/2024 Therapeutic Exercise: Ankle AROM (leg propped on wedge) --> ever/inver, df/pf Small range SLR 3x10 S/L straight leg hip abduction 3x10 Prone knee flexion 3x10 Prone hip extension 3x10 Manual Therapy: IASTM (roller stick) HS, gastroc, quads Modalities: Vaso (L) ankle, 34 degrees, light compression x10 min                                                                                                                               TREATMENT DATE: 03/12/24 See HEP  Scar massage education PT educated pt on PT POC and goals, protocol,  rationale for treatment    PATIENT EDUCATION:  Education details: Updated HEP Person educated: Patient Education method: Explanation, Demonstration, and Handouts Education comprehension: verbalized understanding and returned demonstration  HOME EXERCISE PROGRAM: Access Code: [email or web address] URL: [email or web address] Date: 03/18/2024 Prepared by: Carlynn Herald  Exercises - Supine Ankle Dorsiflexion and Plantarflexion AROM  - 3 x daily - 7 x weekly - 3 sets - 10 reps - Supine Ankle Inversion and Eversion AROM  - 3 x daily - 7 x weekly - 3 sets - 10 reps - Small Range Straight Leg Raise  - 1 x daily - 7 x weekly - 3 sets - 10 reps - Sidelying Hip Abduction  - 1 x daily - 7 x weekly - 3 sets - 10 reps - Prone Hip Extension  - 1 x daily - 7 x weekly - 3 sets - 10 reps - Prone Knee Flexion  - 1 x daily - 7 x weekly - 3 sets - 10 reps  Patient Education - Scar Massage  ASSESSMENT:  CLINICAL IMPRESSION: Exercises continued per protocol. Weight bearing progressed as tolerated per protocol in standing with boot. Gait training performed with RW and progressing weight bearing as tolerated on L LE as tolerated per protocol. Patient able to complete all activities and exercises with no exacerbation of pain. Will trial removing one level of heel lift at next appointment.   EVAL: Patient is a 38 y.o. female who was seen today for physical therapy evaluation and treatment for s/p Lt excision heel spur with achilles tendon repair. She presents with impaired gait and balance, decreased ROM and strength and decreased functional activity tolerance. Pt will benefit from skilled PT to address deficits and improve functional mobility.   OBJECTIVE IMPAIRMENTS: Abnormal gait, decreased activity tolerance, decreased balance, difficulty walking, decreased ROM, decreased strength, increased edema, and pain.   ACTIVITY LIMITATIONS: standing, stairs, transfers, bathing, and locomotion  level  PARTICIPATION LIMITATIONS: meal prep, cleaning, driving, community activity, and yard work  PERSONAL FACTORS: 1-2 comorbidities: shunt placement due to IIH, lumbar dysfunction  are also affecting patient's functional outcome.   REHAB POTENTIAL: Good  CLINICAL DECISION MAKING: Evolving/moderate complexity  EVALUATION COMPLEXITY: Moderate   GOALS: Goals reviewed with patient? Yes  SHORT TERM GOALS: Target date: 04/09/2024  Pt will be independent with initial HEP Baseline: Goal status: INITIAL  2.  Pt will perform gait with LRAD x 10 minutes with pain <= 2/10 Baseline:  Goal status: INITIAL    LONG TERM GOALS: Target date: 05/07/2024  Pt will be independent with advanced HEP Baseline:  Goal status: INITIAL  2.  Pt will improve LEFS to >= 34 to demo improved functional mobility Baseline:  Goal status: INITIAL  3.  Pt will perform gait on a variety of surfaces x 10 minutes without use of AD with pain <= 2/10 Baseline:  Goal status: INITIAL  4.  Pt will demo Lt ankle strength 4+/5 to improve functional activity tolerance Baseline:  Goal status: INITIAL  5.  Pt will demo Lt ankle DF to 8 degrees to improve gait mechanics Baseline:  Goal status: INITIAL   PLAN:  PT FREQUENCY: 2x/week  PT DURATION: 8 weeks  PLANNED INTERVENTIONS: 97164- PT Re-evaluation, 97110-Therapeutic exercises, 97530- Therapeutic activity, 97112- Neuromuscular re-education, 97535- Self Care, 16109- Manual therapy, [id number]- Aquatic Therapy, U0454- Electrical stimulation (unattended), 97016- Vasopneumatic device, [id number]- Ionotophoresis 4mg /ml Dexamethasone, Patient/Family education, Balance training, Stair training, Taping, Dry Needling,  DME instructions, Cryotherapy, and Moist heat  PLAN FOR NEXT SESSION: Trial removing one level of heel lift. Progress per protocol; continue gait training weight bearing as tolerated. Review scar massage   Sanjuana Mae, PTA 03/21/2024, 11:54 AM

## 2024-03-24 ENCOUNTER — Encounter: Payer: Self-pay | Admitting: Family Medicine

## 2024-03-25 ENCOUNTER — Ambulatory Visit

## 2024-03-25 ENCOUNTER — Telehealth: Payer: Self-pay

## 2024-03-25 NOTE — Telephone Encounter (Signed)
 Spoke with patient regarding abnormal blood pressure readings that she has been experiencing. She has f/u with PCP tomorrow, so plan to hold on PT until she is seen by PCP with patient in agreement with this plan.   Letitia Libra, PT, DPT, ATC 03/25/24 11:54 AM

## 2024-03-26 ENCOUNTER — Ambulatory Visit: Admitting: Family Medicine

## 2024-03-27 ENCOUNTER — Ambulatory Visit: Attending: Family Medicine

## 2024-04-01 ENCOUNTER — Encounter: Payer: Self-pay | Admitting: Physical Therapy

## 2024-04-01 ENCOUNTER — Ambulatory Visit: Attending: Podiatry | Admitting: Physical Therapy

## 2024-04-01 DIAGNOSIS — M25572 Pain in left ankle and joints of left foot: Secondary | ICD-10-CM | POA: Diagnosis present

## 2024-04-01 DIAGNOSIS — M5459 Other low back pain: Secondary | ICD-10-CM | POA: Insufficient documentation

## 2024-04-01 DIAGNOSIS — R262 Difficulty in walking, not elsewhere classified: Secondary | ICD-10-CM | POA: Insufficient documentation

## 2024-04-01 DIAGNOSIS — R29898 Other symptoms and signs involving the musculoskeletal system: Secondary | ICD-10-CM | POA: Diagnosis present

## 2024-04-01 DIAGNOSIS — M6281 Muscle weakness (generalized): Secondary | ICD-10-CM | POA: Diagnosis present

## 2024-04-01 NOTE — Therapy (Signed)
 OUTPATIENT PHYSICAL THERAPY LOWER EXTREMITY TREATMENT   Patient Name: UNITY LUEPKE MRN: [id number] DOB:[birth year], 38 y.o., female Today's Date: 04/01/2024  END OF SESSION:  PT End of Session - 04/01/24 1649     Visit Number 4    Number of Visits 16    Date for PT Re-Evaluation 05/07/24    Authorization Type [employer]    Authorization - Visit Number 4    PT Start Time 1615    PT Stop Time 1655    PT Time Calculation (min) 40 min    Activity Tolerance Patient tolerated treatment well    Behavior During Therapy WFL for tasks assessed/performed              Past Medical History:  Diagnosis Date   Allergy    Latex   Anxiety    Asthma    Depression    GERD (gastroesophageal reflux disease)    Hypertension    Thyroid disease    Past Surgical History:  Procedure Laterality Date   CHOLECYSTECTOMY     ESOPHAGUS SURGERY     Patient Active Problem List   Diagnosis Date Noted   Spinal stenosis of lumbar region without neurogenic claudication 11/15/2023   Achilles tendonitis 08/15/2023   DJD of left AC (acromioclavicular) joint 06/28/2023   IDA (iron deficiency anemia) 02/27/2023   Tachycardia 02/22/2023   AC joint arthropathy 12/06/2022   Increased frequency of urination 08/22/2022   Lower extremity edema 04/24/2022   PTSD (post-traumatic stress disorder) 03/18/2021   Allergic rhinitis 01/06/2021   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   Hypothyroidism due to Hashimoto's thyroiditis 06/15/2016   Gastroesophageal reflux disease without esophagitis 09/01/2014   Hypertension 10/22/2008   Migraines 05/27/2008    PCP: [name]  REFERRING PROVIDER: [name]  REFERRING DIAG: Lt achilles tendonitis, Lt foot surgery  THERAPY DIAG:  Difficulty in walking, not elsewhere classified  Muscle weakness (generalized)  Pain in left ankle and joints of left foot  Rationale for Evaluation and Treatment: Rehabilitation  ONSET DATE: 02/22/24  SUBJECTIVE:   SUBJECTIVE  STATEMENT: Patient returns after ED visit due to high blood pressure. She states her BP was 138/93 at home. She saw cardiology and she has a follow up next week. She is able to perform WBAT and walk without the RW. Pt states she tried removing heel lift but had increased pain so she added it back  EVAL: Pt is s/p Lt excision heel spur with achilles tendon repair on 02/22/24. She was initially NWB for 2 weeks and has been PWB with RW for the past week. She reports she feels burning pain in the ball of her foot usually later in the day after she has been active. She reports she continues to have swelling in Lt foot and ankle, especially when the foot has not been elevated for a while. She has been gaining independence with ADLs and IADLs. 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 Yes: NPRS scale: 2/10 currently 7/10 at worst Pain location: Lt foot/ankle Pain description: burning Aggravating factors: prolonged walking or standing, dependent position Relieving factors: rest, elevation, meds  PRECAUTIONS: None  RED FLAGS: None   WEIGHT BEARING RESTRICTIONS:  protected weight bearing with AD - per protocol  FALLS:  Has patient fallen in last 6 months? 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   PATIENT SURVEYS:  LEFS 25/80  COGNITION: Overall cognitive status: Within functional limits for tasks assessed     EDEMA:   Circumferential: 27 cm  on Lt 26.5cm Rt    PALPATION: Lt toes blue, cold to touch - pt states MD aware and she has been cleared by vascular Mild TTP around incision Incision closed  LOWER EXTREMITY MMT Not assessed due to protocol limitations  LOWER EXTREMITY ROM:  AROM Right eval Left eval  Hip flexion    Hip extension    Hip abduction    Hip adduction    Hip internal rotation    Hip external rotation    Knee flexion    Knee extension    Ankle dorsiflexion  -5  Ankle plantarflexion  45  Ankle inversion  17  Ankle eversion  15   (Blank rows = not tested)   GAIT: Distance walked: 100' Assistive device utilized: Environmental consultant - 2 wheeled Level of assistance: Modified independence Comments: step to pattern, Lt walking boot   [facility] Adult PT Treatment:                                                DATE: 04/01/24 Therapeutic Exercise/Activity: BP 139/100 - monitored symptoms throughout session Rocker board for ankle PF/DF (DF to neutral per protocol) BAPS level 1 CW/CCW Toe splay Towel scrunch Ankle AROM (leg propped on wedge) --> ever/inver, df/pf  Modalities: Vaso (L) ankle, 34 degrees, light compression x10 min Self Care: Education on rationale for modified treatment due to elevated BP  [facility] Adult PT Treatment:                                                DATE: 03/21/2024 Therapeutic Exercise: Ankle AROM (leg propped on wedge) --> ever/inver, df/pf Small range SLR 3x10x5" S/L straight leg hip abduction 3x10 Prone knee flexion 3x10 Prone hip extension 3x10 Neuromuscular re-ed: Standing weight shifting --> progressing Wbing as tolerated per protocol Gait: Gait training with RW --> increasing Wbing as tolerated Modalities: Vaso (L) ankle, 34 degrees, light compression x10 min   [facility] Adult PT Treatment:                                                DATE: 03/18/2024 Therapeutic Exercise: Ankle AROM (leg propped on wedge) --> ever/inver, df/pf Small range SLR  3x10 S/L straight leg hip abduction 3x10 Prone knee flexion 3x10 Prone hip extension 3x10 Manual Therapy: IASTM (roller stick) HS, gastroc, quads Modalities: Vaso (L) ankle, 34 degrees, light compression x10 min  TREATMENT DATE: 03/12/24 See HEP Scar massage education PT educated pt on PT POC and goals, protocol, rationale for treatment    PATIENT EDUCATION:  Education details: Updated HEP Person educated: Patient Education method: Explanation, Demonstration, and Handouts Education comprehension: verbalized understanding and returned demonstration  HOME EXERCISE PROGRAM: Access Code: [email or web address] URL: [email or web address] Date: 03/18/2024 Prepared by: Carlynn Herald  Exercises - Supine Ankle Dorsiflexion and Plantarflexion AROM  - 3 x daily - 7 x weekly - 3 sets - 10 reps - Supine Ankle Inversion and Eversion AROM  - 3 x daily - 7 x weekly - 3 sets - 10 reps - Small Range Straight Leg Raise  - 1 x daily - 7 x weekly - 3 sets - 10 reps - Sidelying Hip Abduction  - 1 x daily - 7 x weekly - 3 sets - 10 reps - Prone Hip Extension  - 1 x daily - 7 x weekly - 3 sets - 10 reps - Prone Knee Flexion  - 1 x daily - 7 x weekly - 3 sets - 10 reps  Patient Education - Scar Massage  ASSESSMENT:  CLINICAL IMPRESSION: Treatment focused on gentle ankle AROM. Held on LE exercises due to elevated BP. Pt with no symptoms during session. Pt to follow up with MD tomorrow.  EVAL: Patient is a 38 y.o. female who was seen today for physical therapy evaluation and treatment for s/p Lt excision heel spur with achilles tendon repair. She presents with impaired gait and balance, decreased ROM and strength and decreased functional activity tolerance. Pt will benefit from skilled PT to address deficits and improve functional mobility.   OBJECTIVE IMPAIRMENTS: Abnormal  gait, decreased activity tolerance, decreased balance, difficulty walking, decreased ROM, decreased strength, increased edema, and pain.    GOALS: Goals reviewed with patient? Yes  SHORT TERM GOALS: Target date: 04/09/2024  Pt will be independent with initial HEP Baseline: Goal status: INITIAL  2.  Pt will perform gait with LRAD x 10 minutes with pain <= 2/10 Baseline:  Goal status: INITIAL    LONG TERM GOALS: Target date: 05/07/2024  Pt will be independent with advanced HEP Baseline:  Goal status: INITIAL  2.  Pt will improve LEFS to >= 34 to demo improved functional mobility Baseline:  Goal status: INITIAL  3.  Pt will perform gait on a variety of surfaces x 10 minutes without use of AD with pain <= 2/10 Baseline:  Goal status: INITIAL  4.  Pt will demo Lt ankle strength 4+/5 to improve functional activity tolerance Baseline:  Goal status: INITIAL  5.  Pt will demo Lt ankle DF to 8 degrees to improve gait mechanics Baseline:  Goal status: INITIAL   PLAN:  PT FREQUENCY: 2x/week  PT DURATION: 8 weeks  PLANNED INTERVENTIONS: 97164- PT Re-evaluation, 97110-Therapeutic exercises, 97530- Therapeutic activity, 97112- Neuromuscular re-education, 97535- Self Care, 16109- Manual therapy, [id number]- Aquatic Therapy, U0454- Electrical stimulation (unattended), 97016- Vasopneumatic device, [id number]- Ionotophoresis 4mg /ml Dexamethasone, Patient/Family education, Balance training, Stair training, Taping, Dry Needling, DME instructions, Cryotherapy, and Moist heat  PLAN FOR NEXT SESSION: Monitor BP. Progress per protocol; continue gait training weight bearing as tolerated. Review scar massage   Melora Menon, PT 04/01/2024, 4:50 PM

## 2024-04-02 ENCOUNTER — Other Ambulatory Visit: Payer: Self-pay | Admitting: Family Medicine

## 2024-04-04 ENCOUNTER — Encounter

## 2024-04-21 ENCOUNTER — Ambulatory Visit

## 2024-04-21 DIAGNOSIS — R29898 Other symptoms and signs involving the musculoskeletal system: Secondary | ICD-10-CM

## 2024-04-21 DIAGNOSIS — M6281 Muscle weakness (generalized): Secondary | ICD-10-CM

## 2024-04-21 DIAGNOSIS — M5459 Other low back pain: Secondary | ICD-10-CM

## 2024-04-21 DIAGNOSIS — M25572 Pain in left ankle and joints of left foot: Secondary | ICD-10-CM

## 2024-04-21 DIAGNOSIS — R262 Difficulty in walking, not elsewhere classified: Secondary | ICD-10-CM | POA: Diagnosis not present

## 2024-04-21 NOTE — Therapy (Signed)
 OUTPATIENT PHYSICAL THERAPY LOWER EXTREMITY TREATMENT   Patient Name: Heather Krause MRN: [id number] DOB:December 14, 1986, 38 y.o., female Today's Date: 04/21/2024  END OF SESSION:  PT End of Session - 04/21/24 0849     Visit Number 5    Number of Visits 16    Date for PT Re-Evaluation 05/07/24    Authorization Type [employer]    Progress Note Due on Visit 10    PT Start Time 0848    PT Stop Time 0940    PT Time Calculation (min) 52 min    Activity Tolerance Patient tolerated treatment well    Behavior During Therapy WFL for tasks assessed/performed              Past Medical History:  Diagnosis Date   Allergy    Latex   Anxiety    Asthma    Depression    GERD (gastroesophageal reflux disease)    Hypertension    Thyroid  disease    Past Surgical History:  Procedure Laterality Date   CHOLECYSTECTOMY     ESOPHAGUS SURGERY     Patient Active Problem List   Diagnosis Date Noted   Spinal stenosis of lumbar region without neurogenic claudication 11/15/2023   Achilles tendonitis 08/15/2023   DJD of left AC (acromioclavicular) joint 06/28/2023   IDA (iron  deficiency anemia) 02/27/2023   Tachycardia 02/22/2023   AC joint arthropathy 12/06/2022   Increased frequency of urination 08/22/2022   Lower extremity edema 04/24/2022   PTSD (post-traumatic stress disorder) 03/18/2021   Allergic rhinitis 01/06/2021   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   Hypothyroidism due to Hashimoto's thyroiditis 06/15/2016   Gastroesophageal reflux disease without esophagitis 09/01/2014   Hypertension 10/22/2008   Migraines 05/27/2008    PCP: Augustus Ledger  REFERRING PROVIDER: Arthuro Billow  REFERRING DIAG: Lt achilles tendonitis, Lt foot surgery  THERAPY DIAG:  Difficulty in walking, not elsewhere classified  Muscle weakness (generalized)  Pain in left ankle and joints of left foot  Other low back pain  Other symptoms and signs involving the musculoskeletal system  Rationale for  Evaluation and Treatment: Rehabilitation  ONSET DATE: 02/22/24  SUBJECTIVE:   SUBJECTIVE STATEMENT: Patient reports she has taken all the heel lifts out of boot; states she is wearing the boot all the time except when sleeping. Patient states she is wearing a heart monitor and has follow-up with cardiologist on Thursday 04/25/24. Patient returns to surgeon 05/14/24.  EVAL: Pt is s/p Lt excision heel spur with achilles tendon repair on 02/22/24. She was initially NWB for 2 weeks and has been PWB with RW for the past week. She reports she feels burning pain in the ball of her foot usually later in the day after she has been active. She reports she continues to have swelling in Lt foot and ankle, especially when the foot has not been elevated for a while. She has been gaining independence with ADLs and IADLs. 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 Yes: NPRS scale: 2/10 currently 7/10 at worst Pain location: Lt foot/ankle Pain description: burning Aggravating factors: prolonged walking or standing, dependent position Relieving factors: rest, elevation, meds  PRECAUTIONS: None  RED FLAGS: None   WEIGHT BEARING RESTRICTIONS:  protected weight bearing with AD - per protocol  FALLS:  Has patient fallen in last 6 months? 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   PATIENT SURVEYS:  LEFS 25/80  COGNITION: Overall  cognitive status: Within functional limits for tasks assessed     EDEMA:  Circumferential: 27 cm  on Lt 26.5cm Rt    PALPATION: Lt toes blue, cold to touch - pt states MD aware and she has been cleared by vascular Mild TTP around incision Incision closed  LOWER EXTREMITY MMT Not assessed due to protocol limitations  LOWER EXTREMITY ROM:  AROM Right eval Left eval  Hip flexion    Hip extension    Hip abduction    Hip adduction    Hip internal rotation    Hip external rotation    Knee flexion    Knee extension    Ankle dorsiflexion  -5  Ankle plantarflexion  45  Ankle inversion  17  Ankle eversion  15   (Blank rows = not tested)   GAIT: Distance walked: 100' Assistive device utilized: Environmental consultant - 2 wheeled Level of assistance: Modified independence Comments: step to pattern, Lt walking boot    [facility] Adult PT Treatment:                                                DATE: 04/21/2024 Therapeutic Exercise: Seated: Rocker board ankle DF/PF AAROM Towel scrunch & toe splay Ankle EV/INV AROM Alt toe & heel raises Supine with legs on wedge: Ankle PROM all directions Ankle DF/PF AROM Ankle circles CW/CCW Neuromuscular re-ed: Standing: Foot doming 10x3" Weight shifting onto L LE --> lateral & staggered stance Therapeutic Activity: BP monitored --> 139/79 mmHg (start), 136/87 mmHg (end) Modalities: Vaso (L) ankle, med compression, 34 deg x 10 minutes Ice pack R ankle x 10 min   [facility] Adult PT Treatment:                                                DATE: 04/01/24 Therapeutic Exercise/Activity: BP 139/100 - monitored symptoms throughout session Rocker board for ankle PF/DF (DF to neutral per protocol) BAPS level 1 CW/CCW Toe splay Towel scrunch Ankle AROM (leg propped on wedge) --> ever/inver, df/pf  Modalities: Vaso (L) ankle, 34 degrees, light compression x10 min Self Care: Education on rationale for modified treatment due to elevated BP    [facility] Adult  PT Treatment:                                                DATE: 03/21/2024 Therapeutic Exercise: Ankle AROM (leg propped on wedge) --> ever/inver, df/pf Small range SLR 3x10x5" S/L straight leg hip abduction 3x10 Prone knee flexion 3x10 Prone hip extension 3x10 Neuromuscular re-ed: Standing weight shifting --> progressing Wbing as tolerated per  protocol Gait: Gait training with RW --> increasing Wbing as tolerated Modalities: Vaso (L) ankle, 34 degrees, light compression x10 min                                                                                                                      PATIENT EDUCATION:  Education details: Updated HEP Person educated: Patient Education method: Explanation, Demonstration, and Handouts Education comprehension: verbalized understanding and returned demonstration  HOME EXERCISE PROGRAM: Access Code: [email or web address] URL: [email or web address] Date: 04/21/2024 Prepared by: Sims Duck  Exercises - Supine Ankle Dorsiflexion and Plantarflexion AROM  - 3 x daily - 7 x weekly - 3 sets - 10 reps - Supine Ankle Inversion and Eversion AROM  - 3 x daily - 7 x weekly - 3 sets - 10 reps - Small Range Straight Leg Raise  - 1 x daily - 7 x weekly - 3 sets - 10 reps - Sidelying Hip Abduction  - 1 x daily - 7 x weekly - 3 sets - 10 reps - Prone Hip Extension  - 1 x daily - 7 x weekly - 3 sets - 10 reps - Prone Knee Flexion  - 1 x daily - 7 x weekly - 3 sets - 10 reps - Arch Lifting  - 1 x daily - 7 x weekly - 3 sets - 10 reps - Side to Side Weight Shift with Counter Support  - 1 x daily - 7 x weekly - 3 sets - 10 reps - Staggered Stance Forward Backward Weight Shift with Counter Support  - 1 x daily - 7 x weekly - 3 sets - 10 reps - Standing Heel Raise with Support  - 1 x daily - 7 x weekly - 3 sets - 10 reps  Patient Education - Scar Massage  ASSESSMENT:  CLINICAL IMPRESSION: Exercises progressed per protocol; session focused on  progressing weight bearing in standing and intrinsic foot strengthening. Noted weakness in L LE with standing exercises; initial discomfort/mild pain in L forefoot with gait exercises, however decreased with repetition.  EVAL: Patient is a 38 y.o. female who was seen today for physical therapy evaluation and treatment for s/p Lt excision heel spur with achilles tendon repair. She presents with impaired gait and balance, decreased ROM and strength and decreased functional activity tolerance. Pt will benefit from skilled PT to address deficits and improve functional mobility.   OBJECTIVE IMPAIRMENTS: Abnormal gait, decreased activity tolerance, decreased balance, difficulty walking, decreased ROM, decreased strength, increased edema, and pain.    GOALS: Goals reviewed with patient? Yes  SHORT TERM GOALS: Target date: 04/09/2024  Pt will be independent with initial HEP Baseline: Goal status: MET  2.  Pt will perform gait with LRAD x 10 minutes with pain <= 2/10 Baseline:  Goal status: MET    LONG TERM GOALS: Target date: 05/07/2024  Pt will be independent with advanced HEP Baseline:  Goal status: INITIAL  2.  Pt will improve LEFS to >= 34 to demo improved functional  mobility Baseline:  Goal status: INITIAL  3.  Pt will perform gait on a variety of surfaces x 10 minutes without use of AD with pain <= 2/10 Baseline:  Goal status: INITIAL  4.  Pt will demo Lt ankle strength 4+/5 to improve functional activity tolerance Baseline:  Goal status: INITIAL  5.  Pt will demo Lt ankle DF to 8 degrees to improve gait mechanics Baseline:  Goal status: INITIAL   PLAN:  PT FREQUENCY: 2x/week  PT DURATION: 8 weeks  PLANNED INTERVENTIONS: 97164- PT Re-evaluation, 97110-Therapeutic exercises, 97530- Therapeutic activity, 97112- Neuromuscular re-education, 97535- Self Care, 16109- Manual therapy, [id number]- Aquatic Therapy, U0454- Electrical stimulation (unattended), 97016- Vasopneumatic  device, [id number]- Ionotophoresis 4mg /ml Dexamethasone, Patient/Family education, Balance training, Stair training, Taping, Dry Needling, DME instructions, Cryotherapy, and Moist heat  PLAN FOR NEXT SESSION: Monitor BP. Progress per protocol; continue gait training weight bearing as tolerated. Update HEP prn.   Flint Hummer, PTA 04/21/2024, 9:34 AM

## 2024-04-23 ENCOUNTER — Ambulatory Visit

## 2024-04-23 DIAGNOSIS — R262 Difficulty in walking, not elsewhere classified: Secondary | ICD-10-CM | POA: Diagnosis not present

## 2024-04-23 DIAGNOSIS — M6281 Muscle weakness (generalized): Secondary | ICD-10-CM

## 2024-04-23 DIAGNOSIS — M25572 Pain in left ankle and joints of left foot: Secondary | ICD-10-CM

## 2024-04-23 NOTE — Therapy (Addendum)
 OUTPATIENT PHYSICAL THERAPY LOWER EXTREMITY TREATMENT PHYSICAL THERAPY DISCHARGE SUMMARY  Visits from Start of Care: 6  Current functional level related to goals / functional outcomes: See goals below   Remaining deficits: Status unknown   Education / Equipment: N/A   Patient agrees to discharge. Patient goals were partially met. Patient is being discharged due to not returning since the last visit.   Patient Name: Heather Krause MRN: [id number] DOB:07-Jun-1986, 38 y.o., female Today's Date: 04/23/2024  END OF SESSION:  PT End of Session - 04/23/24 1526     Visit Number 6    Number of Visits 16    Date for PT Re-Evaluation 05/07/24    Authorization Type [employer]    Progress Note Due on Visit 10    PT Start Time 1527    PT Stop Time 1555    PT Time Calculation (min) 28 min    Activity Tolerance Patient tolerated treatment well    Behavior During Therapy WFL for tasks assessed/performed               Past Medical History:  Diagnosis Date   Allergy    Latex   Anxiety    Asthma    Depression    GERD (gastroesophageal reflux disease)    Hypertension    Thyroid  disease    Past Surgical History:  Procedure Laterality Date   CHOLECYSTECTOMY     ESOPHAGUS SURGERY     Patient Active Problem List   Diagnosis Date Noted   Spinal stenosis of lumbar region without neurogenic claudication 11/15/2023   Achilles tendonitis 08/15/2023   DJD of left AC (acromioclavicular) joint 06/28/2023   IDA (iron  deficiency anemia) 02/27/2023   Tachycardia 02/22/2023   AC joint arthropathy 12/06/2022   Increased frequency of urination 08/22/2022   Lower extremity edema 04/24/2022   PTSD (post-traumatic stress disorder) 03/18/2021   Allergic rhinitis 01/06/2021   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   Hypothyroidism due to Hashimoto's thyroiditis 06/15/2016   Gastroesophageal reflux disease without esophagitis 09/01/2014   Hypertension 10/22/2008   Migraines 05/27/2008    PCP:  Alvia  REFERRING PROVIDER: Brooks Ellen  REFERRING DIAG: Lt achilles tendonitis, Lt foot surgery  THERAPY DIAG:  Difficulty in walking, not elsewhere classified  Muscle weakness (generalized)  Pain in left ankle and joints of left foot  Rationale for Evaluation and Treatment: Rehabilitation  ONSET DATE: 02/22/24  SUBJECTIVE:   SUBJECTIVE STATEMENT: Patient took out last heel riser in the boot last week and has been doing ok. Still feels occasional tingling noticing mostly if she is out of the boot for showering. Her blood pressure continues to fluctuate. Denies headache, chest pain, blurred vision, etc.   EVAL: Pt is s/p Lt excision heel spur with achilles tendon repair on 02/22/24. She was initially NWB for 2 weeks and has been PWB with RW for the past week. She reports she feels burning pain in the ball of her foot usually later in the day after she has been active. She reports she continues to have swelling in Lt foot and ankle, especially when the foot has not been elevated for a while. She has been gaining independence with ADLs and IADLs. 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 Yes: NPRS scale: none currently; at worst 4/10 Pain location: Lt foot/ankle Pain description: sore Aggravating factors: overactivity  Relieving factors: rest, elevation, meds  PRECAUTIONS: None  RED FLAGS: None   WEIGHT BEARING RESTRICTIONS: protected weight bearing with AD - per protocol  FALLS:  Has patient fallen in last 6 months? 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   PATIENT SURVEYS:  LEFS 25/80  COGNITION: Overall cognitive status: Within functional limits for tasks assessed     EDEMA:  Circumferential: 27 cm on Lt 26.5cm Rt    PALPATION: Lt toes blue, cold to touch - pt states MD aware and she has been cleared by vascular Mild TTP around incision Incision closed  LOWER EXTREMITY MMT Not assessed due to protocol limitations  LOWER EXTREMITY ROM:  AROM Right eval Left eval  Hip flexion    Hip extension    Hip abduction    Hip adduction    Hip internal rotation    Hip external rotation    Knee flexion    Knee extension    Ankle dorsiflexion  -5  Ankle plantarflexion  45  Ankle inversion  17  Ankle eversion  15   (Blank rows = not tested)   GAIT: Distance walked: 100' Assistive device utilized: Environmental Consultant - 2 wheeled Level of assistance: Modified independence Comments: step to pattern, Lt walking boot  [facility] Adult PT Treatment:                                                DATE: 04/23/24 Automatic blood pressure: 155/105; waited 3 minutes: 155/107 Manual blood pressure: 156/102   Self Care: Discussed protocol and weight-bearing progression with recommendation to bring crutches and tennis shoes at next visit.  Discussed blood pressure findings and recommendation to f/u urgently with provider due to elevated diastolic reading   [facility] Adult PT Treatment:                                                DATE: 04/21/2024 Therapeutic Exercise: Seated: Rocker board ankle DF/PF AAROM Towel scrunch & toe splay Ankle EV/INV AROM Alt toe & heel raises Supine with legs on wedge: Ankle PROM all directions Ankle DF/PF AROM Ankle circles CW/CCW Neuromuscular re-ed: Standing: Foot doming 10x3 Weight shifting onto L LE --> lateral & staggered stance Therapeutic  Activity: BP monitored --> 139/79 mmHg (start), 136/87 mmHg (end) Modalities: Vaso (L) ankle, med compression, 34 deg x 10 minutes Ice pack R ankle x 10 min   [facility] Adult PT Treatment:                                                DATE: 04/01/24 Therapeutic Exercise/Activity: BP 139/100 - monitored symptoms throughout session Rocker board for ankle PF/DF (  DF to neutral per protocol) BAPS level 1 CW/CCW Toe splay Towel scrunch Ankle AROM (leg propped on wedge) --> ever/inver, df/pf  Modalities: Vaso (L) ankle, 34 degrees, light compression x10 min Self Care: Education on rationale for modified treatment due to elevated BP    [facility] Adult PT Treatment:                                                DATE: 03/21/2024 Therapeutic Exercise: Ankle AROM (leg propped on wedge) --> ever/inver, df/pf Small range SLR 3x10x5 S/L straight leg hip abduction 3x10 Prone knee flexion 3x10 Prone hip extension 3x10 Neuromuscular re-ed: Standing weight shifting --> progressing Wbing as tolerated per protocol Gait: Gait training with RW --> increasing Wbing as tolerated Modalities: Vaso (L) ankle, 34 degrees, light compression x10 min                                                                                                                      PATIENT EDUCATION:  Education details: Updated HEP Person educated: Patient Education method: Explanation, Demonstration, and Handouts Education comprehension: verbalized understanding and returned demonstration  HOME EXERCISE PROGRAM: Access Code: [email or web address] URL: [email or web address] Date: 04/21/2024 Prepared by: Lamarr Price  Exercises - Supine Ankle Dorsiflexion and Plantarflexion AROM  - 3 x daily - 7 x weekly - 3 sets - 10 reps - Supine Ankle Inversion and Eversion AROM  - 3 x daily - 7 x weekly - 3 sets - 10 reps - Small Range Straight Leg Raise  - 1 x daily - 7 x weekly - 3 sets - 10 reps - Sidelying Hip Abduction  - 1 x  daily - 7 x weekly - 3 sets - 10 reps - Prone Hip Extension  - 1 x daily - 7 x weekly - 3 sets - 10 reps - Prone Knee Flexion  - 1 x daily - 7 x weekly - 3 sets - 10 reps - Arch Lifting  - 1 x daily - 7 x weekly - 3 sets - 10 reps - Side to Side Weight Shift with Counter Support  - 1 x daily - 7 x weekly - 3 sets - 10 reps - Staggered Stance Forward Backward Weight Shift with Counter Support  - 1 x daily - 7 x weekly - 3 sets - 10 reps - Standing Heel Raise with Support  - 1 x daily - 7 x weekly - 3 sets - 10 reps  Patient Education - Scar Massage  ASSESSMENT:  CLINICAL IMPRESSION: Due to elevated blood pressure patient was not able to participate in therapeutic exercise today. It was recommended that the f/u urgently with provider due to high diastolic reading. She denies headache, chest pain, blurred vision, etc. We discussed protocol progression today with plans to begin weaning from CAM boot at next session with recommendation to bring tennis shoes  and crutches to work on investment banker, operational. Patient left session in no obvious distress and verbalized understanding of instructions regarding her blood pressure.   EVAL: Patient is a 38 y.o. female who was seen today for physical therapy evaluation and treatment for s/p Lt excision heel spur with achilles tendon repair. She presents with impaired gait and balance, decreased ROM and strength and decreased functional activity tolerance. Pt will benefit from skilled PT to address deficits and improve functional mobility.   OBJECTIVE IMPAIRMENTS: Abnormal gait, decreased activity tolerance, decreased balance, difficulty walking, decreased ROM, decreased strength, increased edema, and pain.    GOALS: Goals reviewed with patient? Yes  SHORT TERM GOALS: Target date: 04/09/2024  Pt will be independent with initial HEP Baseline: Goal status: MET  2.  Pt will perform gait with LRAD x 10 minutes with pain <= 2/10 Baseline:  Goal status: MET    LONG  TERM GOALS: Target date: 05/07/2024  Pt will be independent with advanced HEP Baseline:  Goal status: INITIAL  2.  Pt will improve LEFS to >= 34 to demo improved functional mobility Baseline:  Goal status: INITIAL  3.  Pt will perform gait on a variety of surfaces x 10 minutes without use of AD with pain <= 2/10 Baseline:  Goal status: INITIAL  4.  Pt will demo Lt ankle strength 4+/5 to improve functional activity tolerance Baseline:  Goal status: INITIAL  5.  Pt will demo Lt ankle DF to 8 degrees to improve gait mechanics Baseline:  Goal status: INITIAL   PLAN:  PT FREQUENCY: 2x/week  PT DURATION: 8 weeks  PLANNED INTERVENTIONS: 97164- PT Re-evaluation, 97110-Therapeutic exercises, 97530- Therapeutic activity, 97112- Neuromuscular re-education, 97535- Self Care, [id number]- Manual therapy, [phone]- Aquatic Therapy, [id number]- Electrical stimulation (unattended), 97016- Vasopneumatic device, [id number]- Ionotophoresis 4mg /ml Dexamethasone, Patient/Family education, Balance training, Stair training, Taping, Dry Needling, DME instructions, Cryotherapy, and Moist heat   Lucie Meeter, PT, DPT, ATC 04/23/24 4:09 PM  Lucie Meeter, PT, DPT, ATC 11/25/24 12:03 PM

## 2024-04-28 ENCOUNTER — Ambulatory Visit

## 2024-04-28 ENCOUNTER — Telehealth: Payer: Self-pay

## 2024-04-28 NOTE — Telephone Encounter (Signed)
 Patient called and requested labs to be done at [facility]. Orders faxed

## 2024-04-30 ENCOUNTER — Other Ambulatory Visit: Payer: Self-pay | Admitting: Family Medicine

## 2024-05-01 ENCOUNTER — Ambulatory Visit

## 2024-05-05 ENCOUNTER — Ambulatory Visit: Attending: Family Medicine

## 2024-05-08 ENCOUNTER — Encounter

## 2024-05-08 DIAGNOSIS — R9439 Abnormal result of other cardiovascular function study: Secondary | ICD-10-CM | POA: Insufficient documentation

## 2024-05-29 ENCOUNTER — Other Ambulatory Visit: Payer: Self-pay | Admitting: Family Medicine

## 2024-05-29 NOTE — Telephone Encounter (Signed)
 Pt not seen since 07/04/23. Unable to refill meds

## 2024-06-05 ENCOUNTER — Encounter: Payer: Self-pay | Admitting: Medical Oncology

## 2024-06-05 ENCOUNTER — Ambulatory Visit: Payer: Self-pay | Admitting: Medical Oncology

## 2024-06-05 ENCOUNTER — Inpatient Hospital Stay: Admitting: Medical Oncology

## 2024-06-05 ENCOUNTER — Inpatient Hospital Stay: Attending: Hematology & Oncology

## 2024-06-05 VITALS — BP 129/87 | HR 80 | Temp 98.2°F | Resp 18 | Ht 65.0 in

## 2024-06-05 DIAGNOSIS — D509 Iron deficiency anemia, unspecified: Secondary | ICD-10-CM | POA: Insufficient documentation

## 2024-06-05 LAB — CBC
HCT: 41.7 % (ref 36.0–46.0)
Hemoglobin: 14.3 g/dL (ref 12.0–15.0)
MCH: 32.6 pg (ref 26.0–34.0)
MCHC: 34.3 g/dL (ref 30.0–36.0)
MCV: 95 fL (ref 80.0–100.0)
Platelets: 362 10*3/uL (ref 150–400)
RBC: 4.39 MIL/uL (ref 3.87–5.11)
RDW: 12.8 % (ref 11.5–15.5)
WBC: 6.8 10*3/uL (ref 4.0–10.5)
nRBC: 0 % (ref 0.0–0.2)

## 2024-06-05 LAB — FERRITIN: Ferritin: 72 ng/mL (ref 11–307)

## 2024-06-05 LAB — IRON AND IRON BINDING CAPACITY (CC-WL,HP ONLY)
Iron: 67 ug/dL (ref 28–170)
Saturation Ratios: 16 % (ref 10.4–31.8)
TIBC: 428 ug/dL (ref 250–450)
UIBC: 361 ug/dL

## 2024-06-05 LAB — RETIC PANEL
Immature Retic Fract: 13.4 % (ref 2.3–15.9)
RBC.: 4.38 MIL/uL (ref 3.87–5.11)
Retic Count, Absolute: 106.4 10*3/uL (ref 19.0–186.0)
Retic Ct Pct: 2.4 % (ref 0.4–3.1)
Reticulocyte Hemoglobin: 37.1 pg (ref >27.9)

## 2024-06-05 NOTE — Progress Notes (Signed)
 Hematology and Oncology Follow Up Visit  Heather Krause [id number] May 02, 1986 38 y.o. 06/05/2024  Past Medical History:  Diagnosis Date   Allergy    Latex   Anxiety    Asthma    Depression    GERD (gastroesophageal reflux disease)    Hypertension    Thyroid  disease     Principle Diagnosis:  Anemia  Current Therapy:   IV Iron - Venofer - last dose 07/19/2023 Oral B12 1,000 mcg once daily sublingual     Interim History:  Heather Krause is back for a scheduled follow-up for her anemia.   In general she states that she is doing ok.  She continued to work with her neuro team at [facility]. 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 This went well. She reports improvements in headaches since but still has continued dizziness at times. She had a normal heart cath recently as well. She is working with her cardiologist about her dizziness. She is waiting to see if she can have jugular stent surgery.   She also is followed by Endocrinology for her hypothyroidism.  She also continues to work with her GI team- [facility].   In terms of her IDA she denies any new bleeding or bruising episodes.  She is tolerating IV iron  well when needed. She takes OCP to prevent menstrual cycles.   No SOB, hematuria, hemoptysis, epistaxis, melena.      Wt Readings from Last 3 Encounters:  12/06/23 243 lb (110.2 kg)  11/15/23 246 lb (111.6 kg)  11/15/23 245 lb 3.2 oz (111.2 kg)     Medications:   Current Outpatient Medications:    ALPRAZolam (XANAX) 0.25 MG tablet, Take 0.25 mg by mouth 2 (two) times daily as needed., Disp: , Rfl:    CARTIA XT 240 MG 24 hr capsule, Take 240 mg by mouth daily., Disp: , Rfl:    carvedilol (COREG) 25 MG tablet, Take 25 mg by mouth 2 (two) times daily with a meal., Disp: , Rfl:    cetirizine (ZYRTEC) 10 MG tablet, Take 10 mg by mouth daily., Disp: , Rfl:    chlorthalidone (HYGROTON) 25 MG tablet, Take 25 mg by mouth daily., Disp: , Rfl:    cyclobenzaprine (FLEXERIL) 10  MG tablet, Take 10 mg by mouth 3 (three) times daily as needed for muscle spasms., Disp: , Rfl:    dicyclomine (BENTYL) 10 MG capsule, Take 10 mg by mouth 4 (four) times daily -  before meals and at bedtime., Disp: , Rfl:    diltiazem (DILACOR XR) 240 MG 24 hr capsule, Take 240 mg by mouth daily., Disp: , Rfl:    levothyroxine  (SYNTHROID ) 175 MCG tablet, Take 350 mcg by mouth daily., Disp: , Rfl:    melatonin 5 MG TABS, Take by mouth., Disp: , Rfl:    MULTIPLE VITAMIN PO, Take by mouth., Disp: , Rfl:    norethindrone  (AYGESTIN ) 5 MG tablet, Take 1 tablet by mouth once daily, Disp: 30 tablet, Rfl: 11   ondansetron  (ZOFRAN -ODT) 4 MG disintegrating tablet, Take 1 tablet (4 mg total) by mouth every 8 (eight) hours as needed for nausea or vomiting., Disp: 20 tablet, Rfl: 0   pantoprazole (PROTONIX) 40 MG tablet, Take 1 tablet by mouth daily., Disp: , Rfl:    sertraline  (ZOLOFT ) 100 MG tablet, Take 1 tablet by mouth once daily, Disp: 60 tablet, Rfl: 0   EPINEPHRINE  0.3 mg/0.3 mL IJ SOAJ injection, INJECT ONE PEN INTO THE THIGH OR AS DIRECTED. AFTER ADMINISTRATION CALL 911. IF  ANAPHYLACTIC SYMPTOMS PERSIST AFTER FIRST DOSE, MAY REPEAT DOSE IN 5 TO 15 MINUTES. (Patient not taking: Reported on 06/05/2024), Disp: 2 mL, Rfl: 0   nitroGLYCERIN (NITROSTAT) 0.4 MG SL tablet, Place 0.4 mg under the tongue every 5 (five) minutes as needed. (Patient not taking: Reported on 06/05/2024), Disp: , Rfl:   Allergies:  Allergies  Allergen Reactions   Amitriptyline Swelling and Hypertension    Generalized body swelling, but not throat, face or lips. Could not wake up.   Bee Venom Anaphylaxis   Fluarix Quadrivalent [Influenza Vac Split Quad] Other (See Comments)    Autoimmune response   Ivp Dye [Iodinated Contrast Media] Other (See Comments)    Severe migraine. Patient stated,this was at [facility]. I now have to be pre-medicated before any Imaging studies.   Bisoprolol-Hydrochlorothiazide Swelling    Generalized  swelling but not the face or throat   Diclofenac Sodium Swelling    Generalized swelling but not the face or throat.   Latex Rash    Past Medical History, Surgical history, Social history, and Family History were reviewed and updated.  Review of Systems: Review of Systems  Constitutional:  Positive for fatigue.  HENT:   Negative for nosebleeds.   Respiratory:  Negative for shortness of breath.   Gastrointestinal:  Negative for blood in stool.  Genitourinary:  Negative for hematuria.   Neurological:  Negative for dizziness.     Physical Exam:  height is 5' 5 (1.651 m). Her oral temperature is 98.2 F (36.8 C). Her blood pressure is 129/87 and her pulse is 80. Her respiration is 18 and oxygen saturation is 100%.   Physical Exam Vitals and nursing note reviewed.  Constitutional:      General: She is not in acute distress.    Appearance: Normal appearance. She is obese. She is not ill-appearing, toxic-appearing or diaphoretic.   Cardiovascular:     Rate and Rhythm: Normal rate and regular rhythm.     Heart sounds: Normal heart sounds.  Pulmonary:     Effort: Pulmonary effort is normal.     Breath sounds: Normal breath sounds.   Skin:    General: Skin is warm.     Coloration: Skin is not pale.   Neurological:     General: No focal deficit present.     Mental Status: She is alert and oriented to person, place, and time.    Lab Results  Component Value Date   WBC 6.8 06/05/2024   HGB 14.3 06/05/2024   HCT 41.7 06/05/2024   MCV 95.0 06/05/2024   PLT 362 06/05/2024     Chemistry      Component Value Date/Time   NA 140 10/08/2023 0848   K 4.3 10/08/2023 0848   CL 105 10/08/2023 0848   CO2 27 10/08/2023 0848   BUN 11 10/08/2023 0848   CREATININE 0.89 10/08/2023 0848   CREATININE 0.82 07/04/2023 1428      Component Value Date/Time   CALCIUM 9.0 10/08/2023 0848   ALKPHOS 53 10/08/2023 0848   AST 18 10/08/2023 0848   ALT 33 10/08/2023 0848   BILITOT 0.2 (L)  10/08/2023 0848     Encounter Diagnosis  Name Primary?   Iron  deficiency anemia, unspecified iron  deficiency anemia type Yes    Impression and Plan:  Heather Krause is a 38 y.o. female with IDA of unknown origin. She has had work up with GI, cardiology, etc. Recently levels have been stable.   She is tolerating PRN IV iron   well.  Hgb today is 14.3 Iron  studied pending. Will supplement with IV iron  if needed.   Disposition: RTC 3 months APP, labs (CBC, iron , ferritin) -[place]   Sunnie England PA-C 6/12/202510:15 AM

## 2024-07-22 ENCOUNTER — Encounter: Payer: Self-pay | Admitting: Neurosurgery

## 2024-07-22 ENCOUNTER — Ambulatory Visit
Admission: RE | Admit: 2024-07-22 | Discharge: 2024-07-22 | Disposition: A | Discharge status: Home / Self Care | Payer: Self-pay | Source: Ambulatory Visit | Attending: Neurosurgery | Admitting: Neurosurgery

## 2024-07-22 ENCOUNTER — Other Ambulatory Visit: Payer: Self-pay

## 2024-07-22 ENCOUNTER — Ambulatory Visit (INDEPENDENT_AMBULATORY_CARE_PROVIDER_SITE_OTHER): Admitting: Neurosurgery

## 2024-07-22 VITALS — BP 171/117 | HR 111 | Ht 65.0 in | Wt 232.0 lb

## 2024-07-22 DIAGNOSIS — Z049 Encounter for examination and observation for unspecified reason: Secondary | ICD-10-CM

## 2024-07-22 DIAGNOSIS — G932 Benign intracranial hypertension: Secondary | ICD-10-CM | POA: Diagnosis not present

## 2024-07-22 DIAGNOSIS — Z982 Presence of cerebrospinal fluid drainage device: Secondary | ICD-10-CM | POA: Diagnosis not present

## 2024-07-22 NOTE — Progress Notes (Signed)
 39 year old lady with a history of idiopathic intracranial hypertension in whom I placed a right-sided ventriculoperitoneal shunt over 6 months ago.  At her last visit with me she had significant improvement of her headaches and was doing well.  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  After seeing me, she was told by her cardiology team that the jugular stenosis may be the cause of her intractable hypertension and she went and saw Dr.Hauck, vascular neurosurgeon at [facility], who told her that he did not believe that jugular stenosis would be the cause of hypertension.  He did tell her valve down from 160 down to 130.  She found me after I had moved to this practice and we discussed this in detail: I also do not believe that she has jugular stenosis: If with turning of the head you can get jugular stenosis, it can definitely cause venous outflow problem but you do not spend the entire day with your head turned.  Intermittently you can have change in the flow of your blood vessels with changes in your head position as well as your arm and leg position but this is only temporary and does not result in permanent problems like hypertension.  Therefore, I do not believe she has that problem.  I think going down from 160 down to 130 may have now cause a low pressure headache because she says that initially her headaches were intermittent and now they are permanent.  Therefore, I doubt the valve up to a setting of 140 and I will see her back in 2 weeks.

## 2024-08-01 ENCOUNTER — Telehealth: Payer: Self-pay

## 2024-08-01 DIAGNOSIS — D509 Iron deficiency anemia, unspecified: Secondary | ICD-10-CM

## 2024-08-01 NOTE — Telephone Encounter (Signed)
 Patient called requesting a lab only appointment for Monday (08/04/24). She is having elevated BP and states she feels the same way she has felt when her iron  has been low in the past. I advised patient we would schedule a lab only appointment at her request; however, should her BP or symptoms worsen over the weekend she should seek emergency care. Patient stated understanding. Appointment scheduled as requested and lab orders entered.

## 2024-08-04 ENCOUNTER — Inpatient Hospital Stay: Attending: Hematology & Oncology

## 2024-08-04 DIAGNOSIS — D509 Iron deficiency anemia, unspecified: Secondary | ICD-10-CM | POA: Diagnosis present

## 2024-08-04 LAB — IRON AND IRON BINDING CAPACITY (CC-WL,HP ONLY)
Iron: 75 ug/dL (ref 28–170)
Saturation Ratios: 18 % (ref 10.4–31.8)
TIBC: 419 ug/dL (ref 250–450)
UIBC: 344 ug/dL

## 2024-08-04 LAB — CBC (CANCER CENTER ONLY)
HCT: 42.3 % (ref 36.0–46.0)
Hemoglobin: 15.2 g/dL — ABNORMAL HIGH (ref 12.0–15.0)
MCH: 33 pg (ref 26.0–34.0)
MCHC: 35.9 g/dL (ref 30.0–36.0)
MCV: 92 fL (ref 80.0–100.0)
Platelet Count: 322 10*3/uL (ref 150–400)
RBC: 4.6 MIL/uL (ref 3.87–5.11)
RDW: 12.4 % (ref 11.5–15.5)
WBC Count: 8.5 10*3/uL (ref 4.0–10.5)
nRBC: 0 % (ref 0.0–0.2)

## 2024-08-04 LAB — RETIC PANEL
Immature Retic Fract: 6 % (ref 2.3–15.9)
RBC.: 4.54 MIL/uL (ref 3.87–5.11)
Retic Count, Absolute: 76.7 K/uL (ref 19.0–186.0)
Retic Ct Pct: 1.7 % (ref 0.4–3.1)
Reticulocyte Hemoglobin: 36.7 pg (ref >27.9)

## 2024-08-04 LAB — FERRITIN: Ferritin: 89 ng/mL (ref 11–307)

## 2024-08-05 ENCOUNTER — Ambulatory Visit: Admitting: Neurosurgery

## 2024-08-05 ENCOUNTER — Encounter: Payer: Self-pay | Admitting: Neurosurgery

## 2024-08-05 VITALS — BP 149/114 | HR 93 | Ht 65.0 in | Wt 247.0 lb

## 2024-08-05 DIAGNOSIS — Z982 Presence of cerebrospinal fluid drainage device: Secondary | ICD-10-CM | POA: Diagnosis not present

## 2024-08-05 DIAGNOSIS — G932 Benign intracranial hypertension: Secondary | ICD-10-CM

## 2024-08-05 NOTE — Progress Notes (Signed)
 38 year old lady with a ventriculoperitoneal shunt.  She went to [facility] where the shunt was dialed from 160 down to a setting of 130 and patient started having headaches.  At her last visit on valve the valve up to a setting of 140.  She says that after that, her headaches completely went away.  She is very happy with the outcome.  She continues to pursue weight loss and is also trying to get the treatment done for her renal artery stenosis which is resulting in high blood pressures.  I shared with her that she is welcome to come and see me at any time.  If there is anything I can do for her she is welcome to call me or come and see me.

## 2024-08-19 ENCOUNTER — Ambulatory Visit
Admission: RE | Admit: 2024-08-19 | Discharge: 2024-08-19 | Disposition: A | Discharge status: Home / Self Care | Attending: Internal Medicine | Admitting: Internal Medicine

## 2024-08-19 VITALS — BP 155/115 | HR 104 | Temp 98.3°F | Resp 18 | Ht 65.0 in

## 2024-08-19 DIAGNOSIS — L239 Allergic contact dermatitis, unspecified cause: Secondary | ICD-10-CM | POA: Diagnosis not present

## 2024-08-19 DIAGNOSIS — T7840XA Allergy, unspecified, initial encounter: Secondary | ICD-10-CM

## 2024-08-19 MED ORDER — PREDNISONE 20 MG PO TABS
40.0000 mg | ORAL_TABLET | Freq: Every day | ORAL | 0 refills | Status: AC
Start: 2024-08-19 — End: 2024-08-24

## 2024-08-19 MED ORDER — HYDROCORTISONE 2.5 % EX LOTN: TOPICAL_LOTION | Freq: Two times a day (BID) | CUTANEOUS | 0 refills | Status: AC

## 2024-08-19 MED ORDER — TRIAMCINOLONE ACETONIDE 0.1 % EX CREA: 1.0000 | TOPICAL_CREAM | Freq: Two times a day (BID) | CUTANEOUS | 2 refills | Status: AC

## 2024-08-19 MED ORDER — METHYLPREDNISOLONE SODIUM SUCC 125 MG IJ SOLR
80.0000 mg | Freq: Once | INTRAMUSCULAR | Status: AC
  Administered 2024-08-19: 80 mg via INTRAMUSCULAR

## 2024-08-19 NOTE — ED Triage Notes (Signed)
 Patient c/o rash that started on her neck yesterday now it's down her torso, head and arms.  The areas are itchy and burning.  Patient has taken Hydrocortisone  cream and Clindamycin wipes.  No new soaps, lotions, detergents or food.

## 2024-08-19 NOTE — Discharge Instructions (Addendum)
 Rash on the chest, neck, face and scalp.  This could be secondary to the recent addition of Celebrex although cannot completely rule out other sources.  Given the distribution it does seem to be more of a drug type reaction.  For now recommend holding the Celebrex and talking to the provider who prescribed it to see if they feel it is reasonable to continue the medication.  Due to the involvement in the face and possible difficulty swallowing we will treat aggressively with a steroid injection in clinic as well as steroids by mouth and topical.  We will treat with the following: Medrol  injection given today. This is a steroid to help with swelling and allergic reaction.  Start 08/20/24 Prednisone  40 mg (2 tablets) once daily for 5 days. Take this in the morning.  This is a steroid to help with inflammation and pain.  Triamcinolone  cream twice daily to the affected area as needed for itching/rash.  Do not apply this to the neck or face. Hydrocortisone  cream twice daily to the face and neck as needed for itching/rash. Monitor symptoms, if you develop difficulty breathing, increased difficulty swallowing, swelling of the lips, tongue, throat then recommend going to the emergency room immediately for further evaluation May return to urgent care as needed

## 2024-08-19 NOTE — ED Provider Notes (Signed)
 [facility]    CSN: [id number] Arrival date & time: 08/19/24  1714      History   Chief Complaint Chief Complaint  Patient presents with   Rash    Started on chest yesterday, has spread to neck, face and going down arms now - Entered by patient    HPI Heather Krause is a 38 y.o. female.   38 year old female presents urgent care with complaints of an itching spreading rash on her chest, neck, face and scalp.  She reports that this started yesterday on her chest.  It is spread into her neck, face and scalp.  The areas are extremely itchy.  She reports that she and her husband have been working at another location near [employer] and then yesterday went to [place] .  She denies any known exposures to anything while she was working.  They were outside.  She did not see any specific insects in the area.  She denies any changes in soaps, detergents, foods.  She does have a recent addition to her medications of Celebrex within the last 2 to 3 weeks.   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   Facet arthropathy, lumbar 01/11/2024   Heel spur, left 01/07/2024   Spinal stenosis of lumbar region without neurogenic claudication 11/15/2023   Achilles tendonitis 08/15/2023   IIH (idiopathic intracranial hypertension) 07/06/2023   DJD of left AC (acromioclavicular) joint 06/28/2023   IDA (iron  deficiency anemia) 02/27/2023   Tachycardia 02/22/2023   AC joint arthropathy 12/06/2022   Increased frequency of urination 08/22/2022   Lower extremity edema 04/24/2022   PTSD (post-traumatic stress disorder)  03/18/2021   Allergic rhinitis 01/06/2021   Annular tear of disc 02/04/2019   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      Home Medications    Prior to Admission medications   Medication Sig Start Date End Date Taking? Authorizing Provider  ALPRAZolam (XANAX) 0.25 MG tablet Take 0.25 mg by mouth 2 (two) times daily as needed. 03/30/22  Yes [provider]  aspirin 81 MG chewable tablet 81 mg.   Yes [provider]  CARTIA XT 240 MG 24 hr capsule Take 240 mg by mouth daily. 05/22/24  Yes [provider]  carvedilol (COREG) 25 MG tablet Take 25 mg by mouth 2 (two) times daily. 04/24/24  Yes [provider]  celecoxib (CELEBREX) 200 MG capsule Take 200 mg by mouth daily. 07/28/24 08/27/24 Yes [provider]  cetirizine (ZYRTEC) 10 MG tablet Take 10 mg by mouth daily.   Yes [provider]  chlorthalidone (HYGROTON) 25 MG tablet Take 25 mg by mouth daily. 04/30/24  Yes [provider]  cyclobenzaprine (FLEXERIL)  10 MG tablet Take 10 mg by mouth 3 (three) times daily as needed for muscle spasms. Patient taking differently: Take 10 mg by mouth at bedtime. 04/02/24  Yes [provider]  dicyclomine (BENTYL) 10 MG capsule Take 10 mg by mouth 4 (four) times daily -  before meals and at bedtime.   Yes [provider]  diltiazem (DILACOR XR) 240 MG 24 hr capsule Take 240 mg by mouth daily. 05/22/24  Yes [provider]  EPINEPHRINE  0.3 mg/0.3 mL IJ SOAJ injection INJECT ONE PEN INTO THE THIGH OR AS DIRECTED. AFTER ADMINISTRATION CALL 911. IF ANAPHYLACTIC SYMPTOMS PERSIST AFTER FIRST DOSE, MAY REPEAT DOSE IN 5 TO  15 MINUTES. 02/04/24  Yes Alvia Bring, DO  hydrocortisone  2.5 % lotion Apply topically 2 (two) times daily. Apply to the face and neck 08/19/24  Yes Enriqueta Augusta A, PA-C  hydrOXYzine (ATARAX) 10 MG tablet Take 10 mg by mouth at bedtime as needed. 07/23/24  Yes [provider]  ketorolac (TORADOL) 10 MG tablet Take 10 mg by mouth every 6 (six) hours as needed for moderate pain (pain score 4-6). 02/11/24  Yes [provider]  levothyroxine  (SYNTHROID ) 175 MCG tablet Take 350 mcg by mouth daily.   Yes [provider]  losartan (COZAAR) 25 MG tablet Take 25 mg by mouth daily. 07/14/24  Yes [provider]  melatonin 5 MG TABS Take by mouth.   Yes [provider]  Melatonin-Pyridoxine 5-1 MG TABS Take 2.5 mg by mouth daily.   Yes [provider]  metFORMIN (GLUCOPHAGE-XR) 500 MG 24 hr tablet Take 500 mg by mouth daily with breakfast. 07/04/24  Yes [provider]  MULTIPLE VITAMIN PO Take by mouth.   Yes [provider]  nitroGLYCERIN (NITROSTAT) 0.4 MG SL tablet Place 0.4 mg under the tongue every 5 (five) minutes as needed.   Yes [provider]  norethindrone  (AYGESTIN ) 5 MG tablet Take 1 tablet by mouth once daily 04/30/24  Yes Alvia Bring, DO  ondansetron  (ZOFRAN -ODT) 4 MG disintegrating tablet Take 1 tablet (4 mg total) by mouth every 8 (eight) hours as needed for nausea or vomiting. 05/08/22  Yes Alvia Bring, DO  phentermine (ADIPEX-P) 37.5 MG tablet Take 37.5 mg by mouth daily before breakfast. 07/29/24  Yes [provider]  predniSONE  (DELTASONE ) 20 MG tablet Take 2 tablets (40 mg total) by mouth daily with breakfast for 5 days. 08/19/24 08/24/24 Yes Christena Sunderlin A, PA-C  sertraline  (ZOLOFT ) 100 MG tablet Take 1 tablet by mouth once daily 04/02/24  Yes Alvia Bring, DO  triamcinolone  cream (KENALOG ) 0.1 % Apply 1 Application topically 2 (two) times daily. Do not apply to the face or neck 08/19/24  Yes  Carlee Tesfaye A, PA-C  pantoprazole (PROTONIX) 40 MG tablet Take 1 tablet by mouth daily. 11/19/19 07/22/24  [provider]    Family History Family History  Problem Relation Age of Onset   Hypertension Mother    Aneurysm Mother    Anxiety disorder Mother    Arthritis Mother    Depression Mother    Hypertension Father    Arthritis Father    Cancer Maternal Grandmother    Diabetes Maternal Grandmother    Kidney disease Maternal Grandmother    Vision loss Maternal Grandmother    Varicose Veins Maternal Grandmother    Asthma Maternal Grandfather    Heart disease Paternal Grandfather     Social History Social History   Tobacco Use   Smoking status:  Never    Passive exposure: Never   Smokeless tobacco: Never  Vaping Use   Vaping status: Never Used  Substance Use Topics   Alcohol use: Yes    Comment: Occasionally   Drug use: No     Allergies   Amitriptyline, Bee venom, Fluarix quadrivalent [influenza vac split quad], Honey bee venom, Influenza virus vaccine, Ivp dye [iodinated contrast media], Rizatriptan, Acetazolamide, Bisoprolol-hydrochlorothiazide, Diclofenac sodium, Diclofenac sodium, Famotidine, Silicone, Hibiscus extract, and Latex   Review of Systems Review of Systems  Constitutional:  Negative for chills and fever.  HENT:  Positive for trouble swallowing (Chronic but with a little bit more difficulty yesterday). Negative for ear pain and sore throat.   Eyes:  Negative for pain and visual disturbance.  Respiratory:  Negative for cough and shortness of breath.   Cardiovascular:  Negative for chest pain and palpitations.  Gastrointestinal:  Negative for abdominal pain and vomiting.  Genitourinary:  Negative for dysuria and hematuria.  Musculoskeletal:  Negative for arthralgias and back pain.  Skin:  Positive for rash (Itching, chest, neck, face, scalp). Negative for color change.  Neurological:  Negative for seizures and syncope.  All other systems  reviewed and are negative.    Physical Exam Triage Vital Signs ED Triage Vitals  Encounter Vitals Group     BP 08/19/24 1727 (!) 155/115     Girls Systolic BP Percentile --      Girls Diastolic BP Percentile --      Boys Systolic BP Percentile --      Boys Diastolic BP Percentile --      Pulse Rate 08/19/24 1727 (!) 104     Resp 08/19/24 1727 18     Temp 08/19/24 1727 98.3 F (36.8 C)     Temp Source 08/19/24 1727 Oral     SpO2 08/19/24 1727 99 %     Weight --      Height 08/19/24 1729 5' 5 (1.651 m)     Head Circumference --      Peak Flow --      Pain Score 08/19/24 1729 2     Pain Loc --      Pain Education --      Exclude from Growth Chart --    No data found.  Updated Vital Signs BP (!) 155/115 (BP Location: Right Arm)   Pulse (!) 104   Temp 98.3 F (36.8 C) (Oral)   Resp 18   Ht 5' 5 (1.651 m)   SpO2 99%   BMI 41.10 kg/m   Visual Acuity Right Eye Distance:   Left Eye Distance:   Bilateral Distance:    Right Eye Near:   Left Eye Near:    Bilateral Near:     Physical Exam Vitals and nursing note reviewed.  Constitutional:      General: She is not in acute distress.    Appearance: She is well-developed.  HENT:     Head: Normocephalic and atraumatic.     Mouth/Throat:     Mouth: Mucous membranes are moist.     Pharynx: No pharyngeal swelling, oropharyngeal exudate or posterior oropharyngeal erythema.  Eyes:     Conjunctiva/sclera: Conjunctivae normal.  Cardiovascular:     Rate and Rhythm: Normal rate and regular rhythm.     Heart sounds: No murmur heard. Pulmonary:     Effort: Pulmonary effort is normal. No respiratory distress.     Breath sounds: Normal breath sounds.  Abdominal:     Palpations: Abdomen  is soft.     Tenderness: There is no abdominal tenderness.  Musculoskeletal:        General: No swelling.     Cervical back: Neck supple.  Skin:    General: Skin is warm and dry.     Capillary Refill: Capillary refill takes less than 2  seconds.     Findings: Rash present. Rash is papular and urticarial.     Comments: Raised urticarial appearing rash on the chest, neck, face and scalp  Neurological:     Mental Status: She is alert.  Psychiatric:        Mood and Affect: Mood normal.      UC Treatments / Results  Labs (all labs ordered are listed, but only abnormal results are displayed) Labs Reviewed - No data to display  EKG   Radiology No results found.  Procedures Procedures (including critical care time)  Medications Ordered in UC Medications  methylPREDNISolone  sodium succinate (SOLU-MEDROL ) 125 mg/2 mL injection 80 mg (80 mg Intramuscular Given 08/19/24 1755)    Initial Impression / Assessment and Plan / UC Course  I have reviewed the triage vital signs and the nursing notes.  Pertinent labs & imaging results that were available during my care of the patient were reviewed by me and considered in my medical decision making (see chart for details).     Allergic reaction, initial encounter  Allergic dermatitis   Rash on the chest, neck, face and scalp.  This could be secondary to the recent addition of Celebrex although cannot completely rule out other sources.  Given the distribution it does seem to be more of a drug type reaction.  For now recommend holding the Celebrex and talking to the provider who prescribed it to see if they feel it is reasonable to continue the medication.  Due to the involvement in the face and possible difficulty swallowing we will treat aggressively with a steroid injection in clinic as well as steroids by mouth and topical.  We will treat with the following: Medrol  injection given today. This is a steroid to help with swelling and allergic reaction.  Start 08/20/24 Prednisone  40 mg (2 tablets) once daily for 5 days. Take this in the morning.  This is a steroid to help with inflammation and pain.  Triamcinolone  cream twice daily to the affected area as needed for itching/rash.   Do not apply this to the neck or face. Hydrocortisone  cream twice daily to the face and neck as needed for itching/rash. Monitor symptoms, if you develop difficulty breathing, increased difficulty swallowing, swelling of the lips, tongue, throat then recommend going to the emergency room immediately for further evaluation May return to urgent care as needed  Final Clinical Impressions(s) / UC Diagnoses   Final diagnoses:  Allergic reaction, initial encounter  Allergic dermatitis     Discharge Instructions      Rash on the chest, neck, face and scalp.  This could be secondary to the recent addition of Celebrex although cannot completely rule out other sources.  Given the distribution it does seem to be more of a drug type reaction.  For now recommend holding the Celebrex and talking to the provider who prescribed it to see if they feel it is reasonable to continue the medication.  Due to the involvement in the face and possible difficulty swallowing we will treat aggressively with a steroid injection in clinic as well as steroids by mouth and topical.  We will treat with the following: Medrol   injection given today. This is a steroid to help with swelling and allergic reaction.  Start 08/20/24 Prednisone  40 mg (2 tablets) once daily for 5 days. Take this in the morning.  This is a steroid to help with inflammation and pain.  Triamcinolone  cream twice daily to the affected area as needed for itching/rash.  Do not apply this to the neck or face. Hydrocortisone  cream twice daily to the face and neck as needed for itching/rash. Monitor symptoms, if you develop difficulty breathing, increased difficulty swallowing, swelling of the lips, tongue, throat then recommend going to the emergency room immediately for further evaluation May return to urgent care as needed    ED Prescriptions     Medication Sig Dispense Auth. Provider   predniSONE  (DELTASONE ) 20 MG tablet Take 2 tablets (40 mg total) by  mouth daily with breakfast for 5 days. 10 tablet Teresa Norris A, PA-C   triamcinolone  cream (KENALOG ) 0.1 % Apply 1 Application topically 2 (two) times daily. Do not apply to the face or neck 80 g Buel Molder A, PA-C   hydrocortisone  2.5 % lotion Apply topically 2 (two) times daily. Apply to the face and neck 59 mL Brandell Maready, Norris LABOR, PA-C      PDMP not reviewed this encounter.   Teresa Norris LABOR DEVONNA 08/19/24 1758

## 2024-08-22 ENCOUNTER — Ambulatory Visit: Admitting: Neurosurgery

## 2024-09-04 ENCOUNTER — Inpatient Hospital Stay (HOSPITAL_BASED_OUTPATIENT_CLINIC_OR_DEPARTMENT_OTHER): Admitting: Medical Oncology

## 2024-09-04 ENCOUNTER — Ambulatory Visit: Payer: Self-pay | Admitting: Medical Oncology

## 2024-09-04 ENCOUNTER — Inpatient Hospital Stay: Attending: Hematology & Oncology

## 2024-09-04 VITALS — BP 132/104 | HR 109 | Temp 99.1°F | Resp 19 | Ht 65.0 in | Wt 254.0 lb

## 2024-09-04 DIAGNOSIS — D751 Secondary polycythemia: Secondary | ICD-10-CM | POA: Insufficient documentation

## 2024-09-04 DIAGNOSIS — Z8349 Family history of other endocrine, nutritional and metabolic diseases: Secondary | ICD-10-CM | POA: Insufficient documentation

## 2024-09-04 DIAGNOSIS — D509 Iron deficiency anemia, unspecified: Secondary | ICD-10-CM | POA: Insufficient documentation

## 2024-09-04 DIAGNOSIS — G4730 Sleep apnea, unspecified: Secondary | ICD-10-CM | POA: Diagnosis not present

## 2024-09-04 LAB — IRON AND IRON BINDING CAPACITY (CC-WL,HP ONLY)
Iron: 72 ug/dL (ref 28–170)
Saturation Ratios: 18 % (ref 10.4–31.8)
TIBC: 407 ug/dL (ref 250–450)
UIBC: 335 ug/dL

## 2024-09-04 LAB — FERRITIN: Ferritin: 60 ng/mL (ref 11–307)

## 2024-09-04 LAB — CBC
HCT: 43.7 % (ref 36.0–46.0)
Hemoglobin: 15.2 g/dL — ABNORMAL HIGH (ref 12.0–15.0)
MCH: 32.3 pg (ref 26.0–34.0)
MCHC: 34.8 g/dL (ref 30.0–36.0)
MCV: 93 fL (ref 80.0–100.0)
Platelets: 323 K/uL (ref 150–400)
RBC: 4.7 MIL/uL (ref 3.87–5.11)
RDW: 12.9 % (ref 11.5–15.5)
WBC: 8.3 K/uL (ref 4.0–10.5)
nRBC: 0 % (ref 0.0–0.2)

## 2024-09-04 NOTE — Progress Notes (Signed)
 Hematology and Oncology Follow Up Visit  Heather Krause [id number] 1986/05/15 38 y.o. 09/04/2024  Past Medical History:  Diagnosis Date   Allergy    Latex   Anxiety    Asthma    Depression    GERD (gastroesophageal reflux disease)    Hypertension    Thyroid  disease     Principle Diagnosis:  Anemia  Current Therapy:   IV Iron - Venofer - last dose 07/19/2023 Oral B12 1,000 mcg once daily sublingual     Interim History:  Heather Krause is back for a scheduled follow-up for her anemia. She is here with her husband.   She is working on reducing her BP and weight.   She continued to work with her neuro team at [facility]. 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 This went well. She reports improvements in headaches since but still has continued dizziness at times. She had a normal heart cath recently as well. She is working with her cardiologist about her dizziness. She is waiting to see if she can have jugular stent surgery.   She also is followed by Endocrinology for her hypothyroidism.  She also continues to work with her GI team- [facility].   She does snore and has a history of mild sleep apnea which was diagnosed when was a similar weight- around 2015ish. She does not use a CPAP machine.   In terms of her IDA she denies any new bleeding or bruising episodes.  She is tolerating IV iron  well when needed. She takes OCP to prevent menstrual cycles.   No SOB, hematuria, hemoptysis, epistaxis, melena.      Wt Readings from Last 3 Encounters:  09/04/24 254 lb (115.2 kg)  08/05/24 247 lb (112 kg)  07/22/24 232 lb (105.2 kg)     Medications:   Current Outpatient Medications:    ALPRAZolam (XANAX) 0.25 MG tablet, Take 0.25 mg by mouth 2 (two) times daily as needed., Disp: , Rfl:    aspirin 81 MG chewable tablet, 81 mg., Disp: , Rfl:    CARTIA XT 240 MG 24 hr capsule, Take 240 mg by mouth daily., Disp: , Rfl:    carvedilol (COREG) 25 MG tablet, Take 25 mg by mouth 2 (two)  times daily., Disp: , Rfl:    cetirizine (ZYRTEC) 10 MG tablet, Take 10 mg by mouth daily., Disp: , Rfl:    chlorthalidone (HYGROTON) 25 MG tablet, Take 25 mg by mouth daily., Disp: , Rfl:    cyclobenzaprine (FLEXERIL) 10 MG tablet, Take 10 mg by mouth 3 (three) times daily as needed for muscle spasms. (Patient taking differently: Take 10 mg by mouth at bedtime.), Disp: , Rfl:    dicyclomine (BENTYL) 10 MG capsule, Take 10 mg by mouth 4 (four) times daily -  before meals and at bedtime., Disp: , Rfl:    diltiazem (DILACOR XR) 240 MG 24 hr capsule, Take 240 mg by mouth daily., Disp: , Rfl:    EPINEPHRINE  0.3 mg/0.3 mL IJ SOAJ injection, INJECT ONE PEN INTO THE THIGH OR AS DIRECTED. AFTER ADMINISTRATION CALL 911. IF ANAPHYLACTIC SYMPTOMS PERSIST AFTER FIRST DOSE, MAY REPEAT DOSE IN 5 TO 15 MINUTES., Disp: 2 mL, Rfl: 0   hydrocortisone  2.5 % lotion, Apply topically 2 (two) times daily. Apply to the face and neck, Disp: 59 mL, Rfl: 0   hydrOXYzine (ATARAX) 10 MG tablet, Take 10 mg by mouth at bedtime as needed., Disp: , Rfl:    ketorolac (TORADOL) 10 MG tablet, Take 10 mg by  mouth every 6 (six) hours as needed for moderate pain (pain score 4-6)., Disp: , Rfl:    levothyroxine  (SYNTHROID ) 175 MCG tablet, Take 350 mcg by mouth daily., Disp: , Rfl:    losartan (COZAAR) 25 MG tablet, Take 25 mg by mouth daily., Disp: , Rfl:    melatonin 5 MG TABS, Take by mouth., Disp: , Rfl:    Melatonin-Pyridoxine 5-1 MG TABS, Take 2.5 mg by mouth daily., Disp: , Rfl:    metFORMIN (GLUCOPHAGE-XR) 500 MG 24 hr tablet, Take 500 mg by mouth daily with breakfast., Disp: , Rfl:    MULTIPLE VITAMIN PO, Take by mouth., Disp: , Rfl:    nitroGLYCERIN (NITROSTAT) 0.4 MG SL tablet, Place 0.4 mg under the tongue every 5 (five) minutes as needed., Disp: , Rfl:    norethindrone  (AYGESTIN ) 5 MG tablet, Take 1 tablet by mouth once daily, Disp: 30 tablet, Rfl: 11   ondansetron  (ZOFRAN -ODT) 4 MG disintegrating tablet, Take 1 tablet (4 mg  total) by mouth every 8 (eight) hours as needed for nausea or vomiting., Disp: 20 tablet, Rfl: 0   pantoprazole (PROTONIX) 40 MG tablet, Take 1 tablet by mouth daily., Disp: , Rfl:    phentermine (ADIPEX-P) 37.5 MG tablet, Take 37.5 mg by mouth daily before breakfast., Disp: , Rfl:    sertraline  (ZOLOFT ) 100 MG tablet, Take 1 tablet by mouth once daily, Disp: 60 tablet, Rfl: 0   triamcinolone  cream (KENALOG ) 0.1 %, Apply 1 Application topically 2 (two) times daily. Do not apply to the face or neck, Disp: 80 g, Rfl: 2  Allergies:  Allergies  Allergen Reactions   Amitriptyline Hypertension, Swelling and Other (See Comments)    Generalized body swelling, but not throat, face or lips. Could not wake up.  Unknown    Generalized body swelling, but not throat, face or lips. Could not wake up.   Bee Venom Anaphylaxis   Fluarix Quadrivalent [Influenza Vac Split Quad] Other (See Comments)    Autoimmune response   Honey Bee Venom Anaphylaxis   Influenza Virus Vaccine Other (See Comments)    Autoimmune response   Ivp Dye [Iodinated Contrast Media] Other (See Comments)    Severe migraine. Patient stated,this was at [facility]. I now have to be pre-medicated before any Imaging studies.   Rizatriptan Shortness Of Breath and Swelling   Acetazolamide Other (See Comments), Nausea Only and Swelling   Bisoprolol-Hydrochlorothiazide Swelling    Generalized swelling but not the face or throat   Diclofenac Sodium Swelling    Generalized swelling but not the face or throat.   Diclofenac Sodium Nausea Only, Other (See Comments) and Swelling    Generalized swelling but not the face or throat.   Famotidine Nausea Only and Other (See Comments)   Silicone Dermatitis and Other (See Comments)    Plastic IV tape rips skin off. Plastic IV tape rips skin off.   Celebrex [Celecoxib] Rash    Patient states she developed rash from her neck up and started to feel some throat symptoms. Received steroids and this  resolved.   Hibiscus Extract Palpitations and Swelling   Latex Rash    Past Medical History, Surgical history, Social history, and Family History were reviewed and updated.  Review of Systems: Review of Systems  Constitutional:  Positive for fatigue.  HENT:   Negative for nosebleeds.   Respiratory:  Negative for shortness of breath.   Gastrointestinal:  Negative for blood in stool.  Genitourinary:  Negative for hematuria.   Neurological:  Negative for dizziness.     Physical Exam:  height is 5' 5 (1.651 m) and weight is 254 lb (115.2 kg). Her oral temperature is 99.1 F (37.3 C). Her blood pressure is 132/104 (abnormal) and her pulse is 109 (abnormal). Her respiration is 19 and oxygen saturation is 99%.   Physical Exam Vitals and nursing note reviewed.  Constitutional:      General: She is not in acute distress.    Appearance: Normal appearance. She is obese. She is not ill-appearing, toxic-appearing or diaphoretic.  Cardiovascular:     Rate and Rhythm: Normal rate and regular rhythm.     Heart sounds: Normal heart sounds.  Pulmonary:     Effort: Pulmonary effort is normal.     Breath sounds: Normal breath sounds.  Skin:    General: Skin is warm.     Coloration: Skin is not pale.  Neurological:     General: No focal deficit present.     Mental Status: She is alert and oriented to person, place, and time.    Lab Results  Component Value Date   WBC 8.3 09/04/2024   HGB 15.2 (H) 09/04/2024   HCT 43.7 09/04/2024   MCV 93.0 09/04/2024   PLT 323 09/04/2024     Chemistry      Component Value Date/Time   NA 140 10/08/2023 0848   K 4.3 10/08/2023 0848   CL 105 10/08/2023 0848   CO2 27 10/08/2023 0848   BUN 11 10/08/2023 0848   CREATININE 0.89 10/08/2023 0848   CREATININE 0.82 07/04/2023 1428      Component Value Date/Time   CALCIUM 9.0 10/08/2023 0848   ALKPHOS 53 10/08/2023 0848   AST 18 10/08/2023 0848   ALT 33 10/08/2023 0848   BILITOT 0.2 (L) 10/08/2023  0848     Encounter Diagnoses  Name Primary?   Iron  deficiency anemia, unspecified iron  deficiency anemia type Yes   Erythrocytosis    Family history of B12 deficiency    Impression and Plan:  HAILLEE JOHANN is a 38 y.o. female with IDA of unknown origin. She has had work up with GI, cardiology, etc. Recently levels have been stable.   She is tolerating PRN IV iron  well.  Hgb today is 15.2- New x 2. We discussed the importance of treating her sleep apnea. She will discuss more with PCP Iron  studied pending. Will supplement with IV iron  if needed.   Disposition: RTC 3 months APP, labs (CBC, iron , ferritin, B12) -[place]   Lauraine Dais PA-C 9/11/20259:15 AM

## 2024-09-05 ENCOUNTER — Ambulatory Visit: Admitting: Neurosurgery

## 2024-09-05 ENCOUNTER — Encounter: Payer: Self-pay | Admitting: Neurosurgery

## 2024-09-05 VITALS — BP 158/104 | HR 111 | Ht 65.0 in | Wt 254.6 lb

## 2024-09-05 DIAGNOSIS — G932 Benign intracranial hypertension: Secondary | ICD-10-CM

## 2024-09-05 DIAGNOSIS — Z982 Presence of cerebrospinal fluid drainage device: Secondary | ICD-10-CM | POA: Diagnosis not present

## 2024-09-05 NOTE — Progress Notes (Signed)
 38 year old lady with idiopathic intracranial hypertension.  She had a shunt placed and at [facility] and dialed her down to a setting of 130.  Her headaches got worse and when I saw her a few weeks ago, I dialed the valve up to a setting of 140 and this completely resolved her headaches.  She returns today because she had an MRI for her foot and this MRI was done for [facility] because she may need additional surgery.  Therefore I programmed her back up to a setting of 140.  She is going to follow-up with me on an as-needed basis.

## 2024-09-08 ENCOUNTER — Encounter: Payer: Self-pay | Admitting: Neurosurgery

## 2024-09-10 ENCOUNTER — Other Ambulatory Visit: Payer: Self-pay

## 2024-09-10 DIAGNOSIS — G43709 Chronic migraine without aura, not intractable, without status migrainosus: Secondary | ICD-10-CM

## 2024-10-03 ENCOUNTER — Other Ambulatory Visit: Payer: Self-pay | Admitting: Family Medicine

## 2024-10-10 ENCOUNTER — Encounter: Payer: Self-pay | Admitting: Neurosurgery

## 2024-11-24 NOTE — Progress Notes (Signed)
 MFM Consult, Staff Note:  Referring Physician:  Daine Creed, MD, Velma Allyson Hoots, DO, Melanie C. Sharren, NP, Bernardino Ina, MD, Dois Alter, MD, Cynthia Loss, MD  Consult Diagnosis:  preconception consult, chronic hypertension on medication, and history of infertility and recurrent pregnancy loss, hypothyroidism suboptimal control for conception  Physician performing consult:  Reyes CHRISTELLA Pao, MD, MS, FACOG  Heather Krause  is a 38 y.o. [phone] with recurrent pregnancy loss noting 2 IUI cycles and 3 IVF cycles all of which have not yielded a successive livebirth.  She also has a hx of CHTN, hypothyroidism and obesity (BMI 41.9).  Recurrent pregnancy loss is variably defined, however the American Society for Reproductive Medicine (ASRM) defines RPL as two or more losses in the first trimester or later pregnancy  with intrauterine pregnancies defined by ultrasound or histopathologic evaluation of products of conception. Causes are variable but over 60% on histopathology have been related to aneuploidies, most commonly trisomies, which can in some part be related to age of the mother. Risk of first trimester loss increases substantially after age 25.  I explained that while I am not a reproductive endocrinologist or fertility specialist, review of her chart leads me to recommend antiphospholipid antibody syndrome panel.  She declines referral to [facility] as she has not been successful after 5 attempts and is actively seeking the care of another provider in the area, namely Tameer Yalcinkaya, MD to whom upon request I am forwarding this letter.  I then reviewed the potential pregnancy complications associated with chronic hypertension including an increased risk of pregnancy loss (both first trimester and later) as well as increased risk of super imposed preeclampsia, abruption, fetal growth restriction, and preterm birth - often iatrogenic due to the preceeding complications.    During our discussion, I reviewed hypertension as a cause of uteroplacental insufficiency, with increased risk of IUGR, oligohydramnios, and stillbirth. I told her that her hypertension also places her at increased risk for preeclampsia, describing the triad of increased blood pressure, proteinuria, and abnormal edema. Lastly, hypertension (severe range) increases the risk of placental abruption, especially in the setting of superimposed preeclampsia.    I reviewed the essential tenets in the most recent guidelines for management of hypertension in pregnancy in accordance with the American College of Obstetrics and Gynecology expert opinion. We talked about the medical treatment of hypertension in pregnancy. I outlined the different classes of medications, emphasizing that angiotensin enzyme inhibitors and angoitensin receptor blockers are contraindicated, and diuretics are relatively contraindicated.  Accordingly, I recommend she discontinue her losartan and she be started her on either beta-blocker or calcium channel blocker (eg, labetalol  or nifedipine).    We discussed that angiotensin enzyme inhibitors can cause fetal renal dysgenesis, growth restriction, and oligohydramnios. Conversely, I told her that beta-blockers and calcium channel blockers are commonly used to treat hypertension in pregnancy, and that both are felt to be safe for use in pregnancy.    I outlined the usual plan of management for hypertension in pregnancy. She should have her blood pressure carefully followed, and her medications adjusted to keep her BP below 140/90 (note:  our group has come to a consensus after weighing recently published data that adjusting antihypertensive medications to maintain BP below 140/90 reduces risk in pregnancy).   Beta blockers are generally safe for pregnant women. However, occasional cases of neonatal growth restriction, hypoglycemia, respiratory depression, and bradycardia have been reported  after maternal administration of beta blockers, particularly with atenolol.  Once she has a viable intrauterine pregnancy, I recommend taking a daily baby ASA (81 mg) based on a meta-analysis showing initiation of ASA <16 weeks is associated with an 50% reduction in preeclampsia in at risk pregnancies.  Monitor closely for si/sx of preeclampsia development. Our practice recommends starting at [redacted] weeks gestation.  Lastly, we discussed that her TSH of 4.53 on 09/29/2024  is representative of suboptimal control and that she continue to work with her endocrinologist/PCP (Dr. Aura) on achieving optimal control prior to pursuing pregnancy.  We discussed risks of poorly controlled hypothyroidism in pregnancy including miscarriage, growth restriction, preeclampsia, and developmental delay/lower IQ in the neonate.    Summary of Recommendations:  Preconception: Recommendations include:  Continue current prenatal vitamins Have Cardiology decide which is best for HTN control (beta blocker or calcium channel blocker) and discontinue losartan prior to pursuing pregnancy Follow up with REI of her choice If BP 140/90 or greater, recommend titrating and serially increasing until adequate control achieved.  Defer the follow up and management to PCP, Cardiology or gyn as she is not pregnant currently and our scope of practice prior to pregnancy is to provide referring physicians with direction/recommendations. This strategy will achieve good control on pregnancy safe antihypertensive medications.  Achieve good control of hypothyroidism prior to conception APS panel ordered Discontinue ozempic  prior to conception (discussed with patient)   Upon conception/prenatal care: 1. Early dating ultrasound and genetic counseling appointment following confirmation of viable IUP 2. Begin ASA 81mg  po qd upon confirmation of viable IUP  to lower risk of preeclampsia (HTN/BMI) and miscarriage (RPL) 3. Baseline preeclampsia  labs (CBC, CMP and urine protein: creatinine ratio) prior to 20 weeks pregnancy 4. Detailed anatomy ultrasound at 20 weeks 5. Preeclampsia precautions after 20 weeks pregnancy   6. Maternal surveillance as clinically appropriate for prenatal care in the mid and late trimesters. 7. Interval growth ultrasounds at 28, 32, and 36 weeks 8. Initiate weekly BPP at 32 weeks (HTN on medication) 9. Adjust antihypertensive medications in pregnancy to maintain BP <140/90 10. Delivery 38-39 weeks if HTN remains well-controlled and there is no indication for delivery sooner. 11. Aim for 11-20 lb gestational weight gain    Thanks so much for the referral and the opportunity to participate in her care.   Location Information: Patient State (at time of visit): [place]  Patient Location (at time of visit):Home/Other Non-Medical  Provider Location: Hospital/Provider-Based Clinic Is provider licensed to provide clinical care in the current location/state of the patient? Yes   Consent:  Patient's identity was confirmed. Presenting condition or illness was discussed with the patient/personal representative. Current proposed treatment for presenting condition or illness was explained to patient/personal representative along with the likely benefits and any significant risks or complications associated with the provision of treatment by audio/video means. The patient/personal representative verbally authorized treatment to be provided by audio/video, which may include a limited review of patient's current health status, medication, or other treatment recommendations, patient education, and an opportunity to ask questions about condition and treatment. Verbal Consent Granted by Patient/Personal Representative:Yes   Visit Information: Modality: 2-Way Real-Time Audio/Video   On the day of the visit I spent 40 minutes preparing to see the patient, obtaining and/or reviewing separately obtained history,  performing a medically appropriate examination and evaluation, counseling and educating the patient/caregiver, ordering medications, test, or procedures, referring and communicating with other health care professionals (not separately reported), and documenting clinical information in the electronic medical record.This time does not include  any time spent performing procedures or assesments that are separately billable.     Reyes CHRISTELLA Pao, MD, MS, FACOG Associate Professor Section of Maternal-Fetal Medicine [facility]

## 2024-11-25 ENCOUNTER — Ambulatory Visit: Admitting: Diagnostic Neuroimaging

## 2024-11-25 ENCOUNTER — Encounter: Payer: Self-pay | Admitting: Diagnostic Neuroimaging

## 2024-11-25 VITALS — BP 138/101 | HR 104 | Ht 65.0 in | Wt 257.8 lb

## 2024-11-25 DIAGNOSIS — G43109 Migraine with aura, not intractable, without status migrainosus: Secondary | ICD-10-CM

## 2024-11-25 MED ORDER — QULIPTA 60 MG PO TABS: 60.0000 mg | ORAL_TABLET | Freq: Every day | ORAL | 6 refills | Status: AC

## 2024-11-25 MED ORDER — NURTEC 75 MG PO TBDP: 75.0000 mg | ORAL_TABLET | Freq: Every day | ORAL | 6 refills | Status: AC | PRN

## 2024-11-25 NOTE — Patient Instructions (Signed)
 Migraine with aura (since ~age [age] years old; now ~1 per week) - start qulipta 60mg  daily; consider vyepti in future - start nurtec 75mg  as needed for breakthrough migraine  idiopathic intracranial hypertension (pseudotumor cerebri)  - intolerant of acetazolamide; s/p shunt Feb 2025; improved symptoms; follow up with [facility] for papilledema monitoring - follow up sleep study consult (via [facility])

## 2024-11-25 NOTE — Progress Notes (Signed)
 [facility]  PATIENT: Heather Krause DOB: [birth year]  REFERRING CLINICIAN: Rosslyn Dino HERO, MD HISTORY FROM: patient  REASON FOR VISIT: new consult   HISTORICAL  CHIEF COMPLAINT:  Chief Complaint  Patient presents with   RM 7     Patient is here alone for migraines - needs a refill for Toradol it is the only thin that helps     HISTORY OF PRESENT ILLNESS:   38 year old female here for evaluation of headaches.  Patient reports headaches since age 89 to 38 years old associated with pain in the neck, occipital region, behind eyes associated with nausea and vomiting, sensitive light and sound.  Headaches were slightly better in high school but then worsened when she was in her 53s.  Triggering factors would include sunlight or temperature changes.  She was diagnosed with migraine headaches and tried variety medications including Botox.  In July 2024 patient had worsening headaches, had MRI and MRV of the head which were unremarkable.  She underwent lumbar puncture which showed slightly elevated opening pressure at 23 cm of water.  She was tried on acetazolamide but could not tolerate this.  Ultimately she was evaluated for intracranial shunt evaluation and underwent this surgery in February 2025.  Patient now referred here for migraine headache evaluation and management.   REVIEW OF SYSTEMS: Full 14 system review of systems performed and negative with exception of: as per HPI.  ALLERGIES: Allergies  Allergen Reactions   Amitriptyline Hypertension, Swelling and Other (See Comments)    Generalized body swelling, but not throat, face or lips. Could not wake up.  Unknown    Generalized body swelling, but not throat, face or lips. Could not wake up.   Bee Venom Anaphylaxis   Fluarix Quadrivalent [Influenza Vac Split Quad] Other (See Comments)    Autoimmune response   Honey Bee Venom Anaphylaxis   Influenza Virus Vaccine Other (See Comments)    Autoimmune  response   Rizatriptan Shortness Of Breath and Swelling   Acetazolamide Other (See Comments), Nausea Only and Swelling   Bisoprolol-Hydrochlorothiazide Swelling    Generalized swelling but not the face or throat   Diclofenac Sodium Swelling    Generalized swelling but not the face or throat.   Diclofenac Sodium Nausea Only, Other (See Comments) and Swelling    Generalized swelling but not the face or throat.   Famotidine Nausea Only and Other (See Comments)   Gadolinium Derivatives Nausea And Vomiting and Other (See Comments)    Migraine, N, V within hour of MRI scan; needs premedication for future scans   Silicone Dermatitis and Other (See Comments)    Plastic IV tape rips skin off. Plastic IV tape rips skin off.   Celebrex [Celecoxib] Rash    Patient states she developed rash from her neck up and started to feel some throat symptoms. Received steroids and this resolved.   Hibiscus Extract Palpitations and Swelling   Latex Rash    HOME MEDICATIONS: Outpatient Medications Prior to Visit  Medication Sig Dispense Refill   ALPRAZolam (XANAX) 0.25 MG tablet Take 0.25 mg by mouth 2 (two) times daily as needed.     cetirizine (ZYRTEC) 10 MG tablet Take 10 mg by mouth daily.     chlorthalidone (HYGROTON) 25 MG tablet Take 25 mg by mouth daily.     cyclobenzaprine (FLEXERIL) 10 MG tablet Take 10 mg by mouth 3 (three) times daily as needed for muscle spasms.     dicyclomine (BENTYL) 10 MG capsule  Take 10 mg by mouth 4 (four) times daily -  before meals and at bedtime.     EPINEPHRINE  0.3 mg/0.3 mL IJ SOAJ injection INJECT ONE PEN INTO THE THIGH OR AS DIRECTED. AFTER ADMINISTRATION CALL 911. IF ANAPHYLACTIC SYMPTOMS PERSIST AFTER FIRST DOSE, MAY REPEAT DOSE IN 5 TO 15 MINUTES. 2 mL 0   hydrocortisone  2.5 % lotion Apply topically 2 (two) times daily. Apply to the face and neck 59 mL 0   hydrOXYzine (ATARAX) 10 MG tablet Take 10 mg by mouth at bedtime as needed.     ketorolac (TORADOL) 10 MG  tablet Take 10 mg by mouth every 6 (six) hours as needed for moderate pain (pain score 4-6).     levothyroxine  (SYNTHROID ) 175 MCG tablet Take 350 mcg by mouth daily.     losartan (COZAAR) 25 MG tablet Take 25 mg by mouth daily.     Melatonin-Pyridoxine 5-1 MG TABS Take 2.5 mg by mouth daily.     metFORMIN (GLUCOPHAGE-XR) 500 MG 24 hr tablet Take 500 mg by mouth daily with breakfast.     MULTIPLE VITAMIN PO Take by mouth.     nitroGLYCERIN (NITROSTAT) 0.4 MG SL tablet Place 0.4 mg under the tongue every 5 (five) minutes as needed.     ondansetron  (ZOFRAN -ODT) 4 MG disintegrating tablet Take 1 tablet (4 mg total) by mouth every 8 (eight) hours as needed for nausea or vomiting. 20 tablet 0   pantoprazole (PROTONIX) 40 MG tablet Take 1 tablet by mouth daily.     pregabalin (LYRICA) 100 MG capsule Take 100 mg by mouth 2 (two) times daily.     sertraline  (ZOLOFT ) 100 MG tablet Take 1 tablet by mouth once daily 60 tablet 0   triamcinolone  cream (KENALOG ) 0.1 % Apply 1 Application topically 2 (two) times daily. Do not apply to the face or neck 80 g 2   aspirin 81 MG chewable tablet 81 mg. (Patient not taking: Reported on 11/25/2024)     CARTIA XT 240 MG 24 hr capsule Take 240 mg by mouth daily. (Patient not taking: Reported on 11/25/2024)     carvedilol (COREG) 25 MG tablet Take 25 mg by mouth 2 (two) times daily. (Patient not taking: Reported on 11/25/2024)     diltiazem (DILACOR XR) 240 MG 24 hr capsule Take 240 mg by mouth daily. (Patient not taking: Reported on 11/25/2024)     melatonin 5 MG TABS Take by mouth. (Patient not taking: Reported on 11/25/2024)     norethindrone  (AYGESTIN ) 5 MG tablet Take 1 tablet by mouth once daily (Patient not taking: Reported on 11/25/2024) 30 tablet 11   phentermine (ADIPEX-P) 37.5 MG tablet Take 37.5 mg by mouth daily before breakfast. (Patient not taking: Reported on 11/25/2024)     No facility-administered medications prior to visit.    PAST MEDICAL HISTORY: Past  Medical History:  Diagnosis Date   Allergy    Latex   Anxiety    Asthma    Depression    GERD (gastroesophageal reflux disease)    Hypertension    Thyroid  disease     PAST SURGICAL HISTORY: Past Surgical History:  Procedure Laterality Date   CHOLECYSTECTOMY     ESOPHAGUS SURGERY      FAMILY HISTORY: Family History  Problem Relation Age of Onset   Hypertension Mother    Aneurysm Mother    Anxiety disorder Mother    Arthritis Mother    Depression Mother    Hypertension Father  Arthritis Father    Cancer Maternal Grandmother    Diabetes Maternal Grandmother    Kidney disease Maternal Grandmother    Vision loss Maternal Grandmother    Varicose Veins Maternal Grandmother    Asthma Maternal Grandfather    Heart disease Paternal Grandfather     SOCIAL HISTORY: Social History   Socioeconomic History   Marital status: Married    Spouse name: Not on file   Number of children: Not on file   Years of education: Not on file   Highest education level: Associate degree: occupational, scientist, product/process development, or vocational program  Occupational History   Not on file  Tobacco Use   Smoking status: Never    Passive exposure: Never   Smokeless tobacco: Never  Vaping Use   Vaping status: Never Used  Substance and Sexual Activity   Alcohol use: Yes    Comment: Occasionally   Drug use: No   Sexual activity: Yes    Partners: Male    Birth control/protection: None  Other Topics Concern   Not on file  Social History Narrative   Not on file   Social Drivers of Health   Financial Resource Strain: Low Risk  (08/28/2024)   Received from [facility]   Overall Financial Resource Strain (CARDIA)    How hard is it for you to pay for the very basics like food, housing, medical care, and heating?: Not very hard  Food Insecurity: No Food Insecurity (10/13/2024)   Received from [facility]   Hunger Vital Sign    Within the past 12 months, you worried that your food would run out before  you got the money to buy more.: Never true    Within the past 12 months, the food you bought just didn't last and you didn't have money to get more.: Never true  Transportation Needs: No Transportation Needs (10/13/2024)   Received from [facility] - Transportation    In the past 12 months, has lack of transportation kept you from medical appointments or from getting medications?: No    In the past 12 months, has lack of transportation kept you from meetings, work, or from getting things needed for daily living?: No  Physical Activity: Inactive (08/28/2024)   Received from [facility]   Exercise Vital Sign    On average, how many days per week do you engage in moderate to strenuous exercise (like a brisk walk)?: 0 days    Minutes of Exercise per Session: Not on file  Stress: Stress Concern Present (09/25/2024)   Received from [facility] - Occupational Stress Questionnaire    Do you feel stress - tense, restless, nervous, or anxious, or unable to sleep at night because your mind is troubled all the time - these days?: Rather much  Social Connections: Moderately Integrated (08/28/2024)   Received from [facility]   Social Network    How would you rate your social network (family, work, friends)?: Adequate participation with social networks  Intimate Partner Violence: Not At Risk (10/14/2024)   Received from [facility]   HITS    Over the last 12 months how often did your partner physically hurt you?: Never    Over the last 12 months how often did your partner insult you or talk down to you?: Never    Over the last 12 months how often did your partner threaten you with physical harm?: Never    Over the last  12 months how often did your partner scream or curse at you?: Never     PHYSICAL EXAM  GENERAL EXAM/CONSTITUTIONAL: Vitals:  Vitals:   11/25/24 0913  BP: (!) 138/101  Pulse: (!) 104  Weight: 257 lb 12.8 oz (116.9 kg)   Height: 5' 5 (1.651 m)   Body mass index is 42.9 kg/m. Wt Readings from Last 3 Encounters:  11/25/24 257 lb 12.8 oz (116.9 kg)  09/05/24 254 lb 9.6 oz (115.5 kg)  09/04/24 254 lb (115.2 kg)   Patient is in no distress; well developed, nourished and groomed; neck is supple  CARDIOVASCULAR: Examination of carotid arteries is normal; no carotid bruits Regular rate and rhythm, no murmurs Examination of peripheral vascular system by observation and palpation is normal  EYES: Ophthalmoscopic exam of optic discs and posterior segments is normal; no papilledema or hemorrhages No results found.  MUSCULOSKELETAL: Gait, strength, tone, movements noted in Neurologic exam below  NEUROLOGIC: MENTAL STATUS:      No data to display         awake, alert, oriented to person, place and time recent and remote memory intact normal attention and concentration language fluent, comprehension intact, naming intact fund of knowledge appropriate  CRANIAL NERVE:  2nd - no papilledema on fundoscopic exam 2nd, 3rd, 4th, 6th - pupils equal and reactive to light, visual fields full to confrontation, extraocular muscles intact, no nystagmus 5th - facial sensation symmetric 7th - facial strength symmetric 8th - hearing intact 9th - palate elevates symmetrically, uvula midline 11th - shoulder shrug symmetric 12th - tongue protrusion midline  MOTOR:  normal bulk and tone, full strength in the BUE, BLE  SENSORY:  normal and symmetric to light touch, temperature, vibration  COORDINATION:  finger-nose-finger, fine finger movements normal  REFLEXES:  deep tendon reflexes present and symmetric  GAIT/STATION:  narrow based gait    DIAGNOSTIC DATA (LABS, IMAGING, TESTING) - I reviewed patient records, labs, notes, testing and imaging myself where available.  Lab Results  Component Value Date   WBC 8.3 09/04/2024   HGB 15.2 (H) 09/04/2024   HCT 43.7 09/04/2024   MCV 93.0 09/04/2024    PLT 323 09/04/2024      Component Value Date/Time   NA 140 10/08/2023 0848   K 4.3 10/08/2023 0848   CL 105 10/08/2023 0848   CO2 27 10/08/2023 0848   GLUCOSE 90 10/08/2023 0848   BUN 11 10/08/2023 0848   CREATININE 0.89 10/08/2023 0848   CREATININE 0.82 07/04/2023 1428   CALCIUM 9.0 10/08/2023 0848   PROT 6.9 10/08/2023 0848   ALBUMIN 4.1 10/08/2023 0848   AST 18 10/08/2023 0848   ALT 33 10/08/2023 0848   ALKPHOS 53 10/08/2023 0848   BILITOT 0.2 (L) 10/08/2023 0848   GFRNONAA >60 10/08/2023 0848   No results found for: CHOL, HDL, LDLCALC, LDLDIRECT, TRIG, CHOLHDL Lab Results  Component Value Date   HGBA1C 5.3 02/22/2023   Lab Results  Component Value Date   VITAMINB12 449 07/04/2023   Lab Results  Component Value Date   TSH 38.49 (H) 11/28/2022    07/04/2023 MRI brain with and without 1.  No acute intracranial abnormality. No significant change from prior exam.  2.  Similar mild cerebellar tonsillar ectopia which could be an anatomic variant but can also be seen with idiopathic intracranial hypertension (IIH). If this is a clinical concern, lumbar puncture with opening pressures could further evaluate if not previously performed.   07/04/2023 MRV head with and without  1.  No evidence of venous sinus thrombosis.  2.  Similar mild narrowing of the distal left transverse sinus.   10/14/24 CT head - No acute intracranial abnormality identified.     ASSESSMENT AND PLAN  38 y.o. year old female here with:  Meds tried: verapamil (not effective), gabapentin, sertraline , amitriptyline (sedation), topiramate (mouth swelling), sumatriptan, rizatriptan, botox, ajovy, emgality, aimovig, ubrelvy  Dx:  1. Migraine with aura and without status migrainosus, not intractable     PLAN:  Migraine with aura (since ~age [age] years old; now ~1 per week) - start qulipta 60mg  daily; consider vyepti in future - start nurtec 75mg  as needed for breakthrough  migraine  idiopathic intracranial hypertension (pseudotumor cerebri; diagnosed in July 2024; s/p shunt in Feb 2025)  - intolerant of acetazolamide; s/p shunt Feb 2025 with improved symptoms; follow up with [facility] for papilledema monitoring - follow up sleep study consult (via [facility]) - follow up weight mgmt per PCP  Meds ordered this encounter  Medications   Atogepant (QULIPTA) 60 MG TABS    Sig: Take 1 tablet (60 mg total) by mouth daily.    Dispense:  30 tablet    Refill:  6   Rimegepant Sulfate (NURTEC) 75 MG TBDP    Sig: Take 1 tablet (75 mg total) by mouth daily as needed.    Dispense:  8 tablet    Refill:  6   Return in about 6 months (around 05/26/2025) for MyChart visit (15 min).    EDUARD FABIENE HANLON, MD 11/25/2024, 9:50 AM Certified in Neurology, Neurophysiology and Neuroimaging  [facility] [address] [place], [place] [address] [phone]

## 2024-11-26 ENCOUNTER — Other Ambulatory Visit (HOSPITAL_COMMUNITY): Payer: Self-pay

## 2024-11-26 ENCOUNTER — Telehealth: Payer: Self-pay

## 2024-11-26 NOTE — Telephone Encounter (Signed)
 Pharmacy Patient Advocate Encounter  Received notification from [employer] that Prior Authorization for Mickel has been APPROVED from 11/26/2024 to 11/26/2025. Ran test claim, Copay is $0. This test claim was processed through [facility]- copay amounts may vary at other pharmacies due to pharmacy/plan contracts, or as the patient moves through the different stages of their insurance plan.   PA #/Case ID/Reference #: [id number]

## 2024-11-26 NOTE — Telephone Encounter (Signed)
 Clinical questions have been answered and PA submitted. PA currently Pending. Please be advised that most companies allow up to 30 days to make a decision. We will advise when a determination has been made, or follow up in 1 week.   Please reach out to our team, Rx Prior Auth Pool, if you haven't heard back in a week.

## 2024-11-26 NOTE — Telephone Encounter (Signed)
 Pharmacy Patient Advocate Encounter   Received notification from CoverMyMeds that prior authorization for Nurtec is required/requested.   Insurance verification completed.   The patient is insured through [facility].   Per test claim: PA required; PA submitted to above mentioned insurance via Latent Key/confirmation #/EOC [id number] Status is pending

## 2024-11-26 NOTE — Telephone Encounter (Signed)
 Pharmacy Patient Advocate Encounter  Received notification from [employer] that Prior Authorization for Nurtec has been APPROVED from 11/26/2024 to 11/26/2025. Ran test claim, Copay is $0. This test claim was processed through [facility]- copay amounts may vary at other pharmacies due to pharmacy/plan contracts, or as the patient moves through the different stages of their insurance plan.   PA #/Case ID/Reference #: [id number]

## 2024-11-26 NOTE — Telephone Encounter (Signed)
 Pharmacy Patient Advocate Encounter   Received notification from CoverMyMeds that prior authorization for Heather Krause is required/requested.   Insurance verification completed.   The patient is insured through [facility].   Per test claim: PA required; PA started via CoverMyMeds. KEY [id number] . Waiting for clinical questions to populate.

## 2024-12-04 ENCOUNTER — Encounter: Payer: Self-pay | Admitting: Medical Oncology

## 2024-12-04 ENCOUNTER — Inpatient Hospital Stay: Attending: Hematology & Oncology

## 2024-12-04 ENCOUNTER — Inpatient Hospital Stay: Admitting: Medical Oncology

## 2024-12-04 VITALS — BP 129/82 | HR 107 | Temp 98.0°F | Resp 18 | Ht 65.0 in | Wt 257.4 lb

## 2024-12-04 DIAGNOSIS — Z8349 Family history of other endocrine, nutritional and metabolic diseases: Secondary | ICD-10-CM

## 2024-12-04 DIAGNOSIS — D509 Iron deficiency anemia, unspecified: Secondary | ICD-10-CM | POA: Diagnosis present

## 2024-12-04 DIAGNOSIS — D751 Secondary polycythemia: Secondary | ICD-10-CM | POA: Diagnosis not present

## 2024-12-04 LAB — CBC
HCT: 41 % (ref 36.0–46.0)
Hemoglobin: 14.2 g/dL (ref 12.0–15.0)
MCH: 32.9 pg (ref 26.0–34.0)
MCHC: 34.6 g/dL (ref 30.0–36.0)
MCV: 94.9 fL (ref 80.0–100.0)
Platelets: 297 K/uL (ref 150–400)
RBC: 4.32 MIL/uL (ref 3.87–5.11)
RDW: 12.1 % (ref 11.5–15.5)
WBC: 6.5 K/uL (ref 4.0–10.5)
nRBC: 0 % (ref 0.0–0.2)

## 2024-12-04 LAB — FERRITIN: Ferritin: 29 ng/mL (ref 11–307)

## 2024-12-04 LAB — VITAMIN B12: Vitamin B-12: 383 pg/mL (ref 180–914)

## 2024-12-04 LAB — IRON AND IRON BINDING CAPACITY (CC-WL,HP ONLY)
Iron: 76 ug/dL (ref 28–170)
Saturation Ratios: 19 % (ref 10.4–31.8)
TIBC: 398 ug/dL (ref 250–450)
UIBC: 322 ug/dL

## 2024-12-04 NOTE — Progress Notes (Signed)
 Hematology and Oncology Follow Up Visit  Heather Krause [id number] 30-Oct-1986 38 y.o. 12/04/2024  Past Medical History:  Diagnosis Date   Allergy    Latex   Anxiety    Asthma    Depression    GERD (gastroesophageal reflux disease)    Hypertension    Thyroid  disease     Principle Diagnosis:  Anemia  Current Therapy:   IV Iron - Venofer - last dose 07/19/2023 Oral B12 1,000 mcg once daily sublingual     Interim History:  Heather Krause is back for a scheduled follow-up for her anemia.   She states that she has been doing ok overall. She has recently changed her OCP to a lower estrogen level and recently started Qulipta  for migraines.   She continued to work with her neuro team at [facility]. 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 This went well. She reports improvements in headaches since but still has continued dizziness at times. She had a normal heart cath recently as well. She is working with her cardiologist about her dizziness. She is waiting to see if she can have jugular stent surgery.   She also is followed by Endocrinology for her hypothyroidism. Recently her Levothyroxine  was increased.   She also continues to work with her GI team- [facility].   She does snore and has a history of mild sleep apnea which was diagnosed when was a similar weight- around 2015ish. She does not use a CPAP machine. She has a sleep study scheduled early next year.   In terms of her IDA she denies any new bleeding or bruising episodes.  She is tolerating IV iron  well when needed. She takes OCP to prevent menstrual cycles.   No SOB, hematuria, hemoptysis, epistaxis, melena.      Wt Readings from Last 3 Encounters:  12/04/24 257 lb 6.4 oz (116.8 kg)  11/25/24 257 lb 12.8 oz (116.9 kg)  09/05/24 254 lb 9.6 oz (115.5 kg)     Medications:   Current Outpatient Medications:    ALPRAZolam (XANAX) 0.25 MG tablet, Take 0.25 mg by mouth 2 (two) times daily as needed., Disp: , Rfl:    aspirin  81 MG chewable tablet, 81 mg., Disp: , Rfl:    Atogepant  (QULIPTA ) 60 MG TABS, Take 1 tablet (60 mg total) by mouth daily., Disp: 30 tablet, Rfl: 6   CARTIA XT 240 MG 24 hr capsule, Take 240 mg by mouth daily., Disp: , Rfl:    carvedilol (COREG) 25 MG tablet, Take 25 mg by mouth 2 (two) times daily., Disp: , Rfl:    cetirizine (ZYRTEC) 10 MG tablet, Take 10 mg by mouth daily., Disp: , Rfl:    chlorthalidone (HYGROTON) 25 MG tablet, Take 25 mg by mouth daily., Disp: , Rfl:    cyclobenzaprine (FLEXERIL) 10 MG tablet, Take 10 mg by mouth 3 (three) times daily as needed for muscle spasms., Disp: , Rfl:    dicyclomine (BENTYL) 10 MG capsule, Take 10 mg by mouth 4 (four) times daily -  before meals and at bedtime., Disp: , Rfl:    diltiazem (DILACOR XR) 240 MG 24 hr capsule, Take 240 mg by mouth daily., Disp: , Rfl:    hydrocortisone  2.5 % lotion, Apply topically 2 (two) times daily. Apply to the face and neck, Disp: 59 mL, Rfl: 0   hydrOXYzine (ATARAX) 10 MG tablet, Take 10 mg by mouth at bedtime as needed., Disp: , Rfl:    ketorolac (TORADOL) 10 MG tablet, Take 10 mg  by mouth every 6 (six) hours as needed for moderate pain (pain score 4-6)., Disp: , Rfl:    levothyroxine  (SYNTHROID ) 175 MCG tablet, Take 350 mcg by mouth daily., Disp: , Rfl:    losartan (COZAAR) 25 MG tablet, Take 25 mg by mouth daily., Disp: , Rfl:    melatonin 5 MG TABS, Take by mouth., Disp: , Rfl:    Melatonin-Pyridoxine 5-1 MG TABS, Take 2.5 mg by mouth daily., Disp: , Rfl:    metFORMIN (GLUCOPHAGE-XR) 500 MG 24 hr tablet, Take 500 mg by mouth daily with breakfast., Disp: , Rfl:    MULTIPLE VITAMIN PO, Take by mouth., Disp: , Rfl:    nitroGLYCERIN (NITROSTAT) 0.4 MG SL tablet, Place 0.4 mg under the tongue every 5 (five) minutes as needed., Disp: , Rfl:    norethindrone  (AYGESTIN ) 5 MG tablet, Take 1 tablet by mouth once daily, Disp: 30 tablet, Rfl: 11   ondansetron  (ZOFRAN -ODT) 4 MG disintegrating tablet, Take 1 tablet (4 mg  total) by mouth every 8 (eight) hours as needed for nausea or vomiting., Disp: 20 tablet, Rfl: 0   pantoprazole (PROTONIX) 40 MG tablet, Take 1 tablet by mouth daily., Disp: , Rfl:    phentermine (ADIPEX-P) 37.5 MG tablet, Take 37.5 mg by mouth daily before breakfast., Disp: , Rfl:    pregabalin (LYRICA) 100 MG capsule, Take 100 mg by mouth 2 (two) times daily., Disp: , Rfl:    Rimegepant Sulfate (NURTEC) 75 MG TBDP, Take 1 tablet (75 mg total) by mouth daily as needed., Disp: 8 tablet, Rfl: 6   sertraline  (ZOLOFT ) 100 MG tablet, Take 1 tablet by mouth once daily, Disp: 60 tablet, Rfl: 0   triamcinolone  cream (KENALOG ) 0.1 %, Apply 1 Application topically 2 (two) times daily. Do not apply to the face or neck, Disp: 80 g, Rfl: 2   EPINEPHRINE  0.3 mg/0.3 mL IJ SOAJ injection, INJECT ONE PEN INTO THE THIGH OR AS DIRECTED. AFTER ADMINISTRATION CALL 911. IF ANAPHYLACTIC SYMPTOMS PERSIST AFTER FIRST DOSE, MAY REPEAT DOSE IN 5 TO 15 MINUTES. (Patient not taking: Reported on 12/04/2024), Disp: 2 mL, Rfl: 0  Allergies:  Allergies  Allergen Reactions   Amitriptyline Hypertension, Swelling and Other (See Comments)    Generalized body swelling, but not throat, face or lips. Could not wake up.  Unknown    Generalized body swelling, but not throat, face or lips. Could not wake up.   Bee Venom Anaphylaxis   Fluarix Quadrivalent [Influenza Vac Split Quad] Other (See Comments)    Autoimmune response   Honey Bee Venom Anaphylaxis   Influenza Virus Vaccine Other (See Comments)    Autoimmune response   Rizatriptan Shortness Of Breath and Swelling   Acetazolamide Other (See Comments), Nausea Only and Swelling   Bisoprolol-Hydrochlorothiazide Swelling    Generalized swelling but not the face or throat   Diclofenac Sodium Swelling    Generalized swelling but not the face or throat.   Diclofenac Sodium Nausea Only, Other (See Comments) and Swelling    Generalized swelling but not the face or throat.    Famotidine Nausea Only and Other (See Comments)   Gadolinium Derivatives Nausea And Vomiting and Other (See Comments)    Migraine, N, V within hour of MRI scan; needs premedication for future scans   Silicone Dermatitis and Other (See Comments)    Plastic IV tape rips skin off.    Celebrex [Celecoxib] Rash    Patient states she developed rash from her neck up and started to  feel some throat symptoms. Received steroids and this resolved.   Hibiscus Extract Palpitations and Swelling   Latex Rash    Past Medical History, Surgical history, Social history, and Family History were reviewed and updated.  Review of Systems: Review of Systems  Constitutional:  Positive for fatigue.  HENT:   Negative for nosebleeds.   Respiratory:  Negative for shortness of breath.   Gastrointestinal:  Negative for blood in stool.  Genitourinary:  Negative for hematuria.   Neurological:  Negative for dizziness.     Physical Exam:  weight is 257 lb 6.4 oz (116.8 kg). Her oral temperature is 98 F (36.7 C). Her blood pressure is 129/82 and her pulse is 107 (abnormal). Her oxygen saturation is 97%.   Physical Exam Vitals and nursing note reviewed.  Constitutional:      General: She is not in acute distress.    Appearance: Normal appearance. She is obese. She is not ill-appearing, toxic-appearing or diaphoretic.  Cardiovascular:     Rate and Rhythm: Normal rate and regular rhythm.     Heart sounds: Normal heart sounds.  Pulmonary:     Effort: Pulmonary effort is normal.     Breath sounds: Normal breath sounds.  Skin:    General: Skin is warm.     Coloration: Skin is not pale.  Neurological:     General: No focal deficit present.     Mental Status: She is alert and oriented to person, place, and time.    Lab Results  Component Value Date   WBC 6.5 12/04/2024   HGB 14.2 12/04/2024   HCT 41.0 12/04/2024   MCV 94.9 12/04/2024   PLT 297 12/04/2024     Chemistry      Component Value Date/Time    NA 140 10/08/2023 0848   K 4.3 10/08/2023 0848   CL 105 10/08/2023 0848   CO2 27 10/08/2023 0848   BUN 11 10/08/2023 0848   CREATININE 0.89 10/08/2023 0848   CREATININE 0.82 07/04/2023 1428      Component Value Date/Time   CALCIUM 9.0 10/08/2023 0848   ALKPHOS 53 10/08/2023 0848   AST 18 10/08/2023 0848   ALT 33 10/08/2023 0848   BILITOT 0.2 (L) 10/08/2023 0848     Encounter Diagnoses  Name Primary?   Iron  deficiency anemia, unspecified iron  deficiency anemia type Yes   Erythrocytosis    Family history of B12 deficiency     Impression and Plan:  JOURNIE HOWSON is a 38 y.o. female with IDA of unknown origin. She has had work up with GI, cardiology, etc. Recently levels have been stable.   Hgb today is 14.2 She has a sleep study in Feb or March for a sleep consult  Iron  studied pending. Will supplement with IV iron  if needed.   Disposition: RTC 3 months APP, labs (CBC, iron , ferritin, B12) -[place]   Lauraine Dais PA-C 12/11/20259:37 AM

## 2024-12-05 NOTE — Progress Notes (Addendum)
 Daily Treatment  Patient Name:  Heather Krause Date of Birth:  [birth year] Today's Date:  December 05, 2024 Referring Provider: Ivonne Bernardino PARAS, PA-C Visit #: 5 of     Assessment  Assessment details: Treatment diagnosis: Left Achilles pain and limited ankle range of motion                                         Precautions:      Assessment:   Assessment & Plan   Assessment  Assessment details: Pt presents today stating last night was especially painful. Feels there is a correlation between more pain recently and more stretching (holding in stretch for longer periods of time). Therefore, interventions today focused on gentle introduction to resistance work, which was well received with no increase in pain. Qualitative improvement in ROM demonstrated by end of session, though reports feeling stiff again prior to exiting clinic.Did apply ionto again for pain management given reports of benefits from last application. Therefore, pt will continue to benefit from further skilled PT intervention in order to maximize functional mobility.     Plan  Plan details: Resisted  ankle eversion next session, ionto for pain management     Subjective:  Subjective Evaluation    History of Present Illness   Subjective/Mechanism of injury: Pt states that her pain last night was very bad last night and she thinks it is related to the prolonged stretch. Working on exercises consistently but having a lot of trouble with steps.    Objective: There were no vitals filed for this visit. Objective              Treatments Performed:  Interventions Performed    Anything left blank was deferred at this time During all sensitive area treatments, patient provided continual verbal consent before and during each intervention.  Remote Therapeutic Monitoring:   Therapeutic Ex (97110):  -Towel scrunches  -Arch lifting  -Toe yoga  -All of the above for warm up of foot intrinsics   -AROM ankle  pumps and inversion/eversion  -L aknle resisted ankle eversion with red band x 15  -Ankle DF with red band resistance x 2 sets to fatigue    Therapeutic Act (02469):    Manual (02859):    Neuro Re-Ed (02887):    Gait Training [phone]):    Aquatic Therapy [phone]):  Modalities:    Iontophoresis patch administered to distal achilles with education provided to remove with any redness or discomfort and not to leave longer than 4 hours    Education:  The patient was educated regarding: therapy diagnosis, anatomy and physiology, treatment plan, rationale, therapeutic goals, expectations, and home exercise program. All patient questions were entertained and answered today.  The education provided was required by a skilled therapist through active lecturing and/or demonstration and the time spent was included in the appropriate/correlative billed codes.    HEP Provided:   Access Code: [email or web address] URL: [email or web address] Date: 12/05/2024 Prepared by: Virginia  Shoesmith Exercises - Seated Ankle Eversion with Resistance  - 2 x daily - 7 x weekly - 2 sets - 10 reps    Skilled PT required for exercise performance to ensure proper technique for continued safe & effective progression.  Each CPT code is separate and distinct therefore medically necessary.      Goals  1. Pt will be independent with HEP, to promote self efficacy with management of  L achilles pain in the home environment and to reduce the risk of further progression of pain 2. Pt will increase L ankle DF AROM by at least 5*, to facilitate less strain and pain to Achilles during gait  3. Pt will be able to complete at least 10 reps of single leg heel raise for LLE, pain level no higher than 1-2/10, to demonstrate improved functional strength for prolonged walking and stair negotiation 4. Pt will increase PSFS from initial eval score of 3.67 by at least 2, to demonstrate improved pt ability to perform prolonged walking  and standing  Treatment Time  Today's Evaluation/Treatment: 0732 - 0815 Total Time: 43 min Certification Period: 11/11/24 to 02/09/25  Date for PT Re-Evaluation: 01/10/25   Charges  Total Time Code Treatment Minutes: 43  PT Therapeutic Time Entry Therapeutic Exercise Time Entry: 38 (3 units)   Virginia  W [place], PT 12/05/2024 2:44 PM  [facility] AD[facility] [address] [place] [address] Dept: [phone]  Dept Fax: [phone]

## 2024-12-08 ENCOUNTER — Ambulatory Visit: Payer: Self-pay | Admitting: Medical Oncology

## 2024-12-08 ENCOUNTER — Other Ambulatory Visit: Payer: Self-pay

## 2024-12-08 ENCOUNTER — Inpatient Hospital Stay
Admission: RE | Admit: 2024-12-08 | Discharge: 2024-12-08 | Disposition: A | Payer: Self-pay | Source: Ambulatory Visit | Attending: Neurosurgery | Admitting: Neurosurgery

## 2024-12-08 ENCOUNTER — Other Ambulatory Visit: Payer: Self-pay | Admitting: Medical Oncology

## 2024-12-08 DIAGNOSIS — Z049 Encounter for examination and observation for unspecified reason: Secondary | ICD-10-CM

## 2024-12-09 ENCOUNTER — Other Ambulatory Visit: Payer: Self-pay | Admitting: Medical Oncology

## 2024-12-09 ENCOUNTER — Encounter: Payer: Self-pay | Admitting: Neurosurgery

## 2024-12-09 ENCOUNTER — Ambulatory Visit: Admitting: Neurosurgery

## 2024-12-09 VITALS — BP 149/100 | HR 96 | Temp 97.8°F | Ht 66.0 in | Wt 254.4 lb

## 2024-12-09 DIAGNOSIS — Z982 Presence of cerebrospinal fluid drainage device: Secondary | ICD-10-CM

## 2024-12-09 DIAGNOSIS — G932 Benign intracranial hypertension: Secondary | ICD-10-CM | POA: Diagnosis not present

## 2024-12-09 MED ORDER — CYANOCOBALAMIN 1000 MCG/ML IJ SOLN: INTRAMUSCULAR | 0 refills | Status: AC

## 2024-12-09 MED ORDER — NEEDLES & SYRINGES MISC: 1.0000 | 0 refills | Status: AC

## 2024-12-09 NOTE — Progress Notes (Signed)
 38 year old patient with Idiopathic intracranial hypertension last set at 140. She recently had an MRI and came in today for a shunt reprogramming following the MRI 12/15 @1705 .   She affirms that she feels great today outside of a slight headache and that she has no other concerns.  She did have a question about if she could fly or ride rides at an amusement park. She was informed that flying was okay; however, riding rides could interfere with her shunt settings.   Her shunt was reprogrammed to 140.   No follow up at this time. The patient was informed that she is welcome to call and contact at any time should she develop worsening headaches or have any other concerns.

## 2024-12-15 ENCOUNTER — Encounter: Payer: Self-pay | Admitting: Medical Oncology

## 2024-12-15 ENCOUNTER — Inpatient Hospital Stay

## 2024-12-15 VITALS — BP 154/97 | HR 85 | Temp 98.0°F | Resp 18

## 2024-12-15 DIAGNOSIS — D509 Iron deficiency anemia, unspecified: Secondary | ICD-10-CM

## 2024-12-15 MED ORDER — ONDANSETRON HCL 4 MG/2ML IJ SOLN
4.0000 mg | Freq: Once | INTRAMUSCULAR | Status: AC
  Administered 2024-12-15: 4 mg via INTRAVENOUS
  Filled 2024-12-15: qty 2

## 2024-12-15 MED ORDER — METHYLPREDNISOLONE SODIUM SUCC 125 MG IJ SOLR
125.0000 mg | Freq: Once | INTRAMUSCULAR | Status: AC | PRN
  Administered 2024-12-15: 125 mg via INTRAVENOUS
  Filled 2024-12-15: qty 2

## 2024-12-15 MED ORDER — FAMOTIDINE IN NACL 20-0.9 MG/50ML-% IV SOLN
20.0000 mg | Freq: Once | INTRAVENOUS | Status: DC | PRN
  Filled 2024-12-15: qty 50

## 2024-12-15 MED ORDER — IRON SUCROSE 300 MG IVPB - SIMPLE MED
300.0000 mg | Freq: Once | Status: AC
  Administered 2024-12-15: 300 mg via INTRAVENOUS
  Filled 2024-12-15: qty 300

## 2024-12-15 MED ORDER — DIPHENHYDRAMINE HCL 25 MG PO CAPS
25.0000 mg | ORAL_CAPSULE | Freq: Once | ORAL | Status: AC
  Administered 2024-12-15: 25 mg via ORAL

## 2024-12-15 MED ORDER — SODIUM CHLORIDE 0.9 % IV SOLN: INTRAVENOUS | Status: DC

## 2024-12-21 ENCOUNTER — Encounter: Payer: Self-pay | Admitting: Diagnostic Neuroimaging

## 2024-12-22 ENCOUNTER — Inpatient Hospital Stay

## 2024-12-22 VITALS — BP 133/89 | HR 90 | Temp 98.4°F | Resp 18 | Wt 264.0 lb

## 2024-12-22 DIAGNOSIS — D509 Iron deficiency anemia, unspecified: Secondary | ICD-10-CM | POA: Diagnosis not present

## 2024-12-22 MED ORDER — SODIUM CHLORIDE 0.9 % IV SOLN: INTRAVENOUS | Status: DC

## 2024-12-22 MED ORDER — METHYLPREDNISOLONE SODIUM SUCC 125 MG IJ SOLR
125.0000 mg | Freq: Once | INTRAMUSCULAR | Status: AC
  Administered 2024-12-22: 125 mg via INTRAVENOUS
  Filled 2024-12-22: qty 2

## 2024-12-22 MED ORDER — ONDANSETRON HCL 4 MG/2ML IJ SOLN
4.0000 mg | Freq: Once | INTRAMUSCULAR | Status: AC
  Administered 2024-12-22: 4 mg via INTRAVENOUS
  Filled 2024-12-22: qty 2

## 2024-12-22 MED ORDER — DIPHENHYDRAMINE HCL 25 MG PO CAPS
25.0000 mg | ORAL_CAPSULE | Freq: Once | ORAL | Status: AC
  Administered 2024-12-22: 25 mg via ORAL
  Filled 2024-12-22: qty 1

## 2024-12-22 MED ORDER — IRON SUCROSE 300 MG IVPB - SIMPLE MED
300.0000 mg | Freq: Once | Status: AC
  Administered 2024-12-22: 300 mg via INTRAVENOUS
  Filled 2024-12-22: qty 300

## 2024-12-22 NOTE — Progress Notes (Signed)
 " Daily Treatment  Patient Name:  Heather Krause Date of Birth:  March 26, 1986 Today's Date:  December 22, 2024 Referring Provider: Malvina Ellen, DPM Visit #: 7 of     Assessment  Assessment details: Treatment diagnosis: Left Achilles pain and limited ankle range of motion                                         Precautions: Chronic migraine, lumbar facet arthropathy, HTN, Hashimoto's thyroiditis, IIH, Anxiety, GERD, Palpitations, IBS,      Assessment:   Assessment & Plan   Assessment  Assessment details: Pt presents today reporting that L achilles, ankle, and lateral foot remain painful. Had nerve study, was normal, but had a lot of pain with testing. Pt reports she's been working on heel raises at home but more with concentric focus, so adjusted HEP to more isometric focus with heel raises. Also, PT providing education re: neurological component of sx, how hypersensitivity can be addressed via things like self STM, light touch techniques, progressive wear of shoes/socks, and normalized gait practice. Pt will benefit from further skilled PT intervention to follow up on these and progress accordingly.   Prognosis:  good Impairments: abnormal gait, abnormal or restricted ROM, activity intolerance, impaired physical strength, lacks appropriate home exercise program, pain with function and weight bearing intolerance  Plan  Therapy options: will be seen for skilled physical therapy services   Planned modality interventions: cryotherapy, iontophoresis, TENS, thermotherapy (hydrocollator packs) and ultrasound  Planned therapy interventions: abdominal trunk stabilization, balance/weight-bearing training, body mechanics training, dry needling, flexibility, functional ROM exercises, gait training, home exercise program, joint mobilization, manual therapy, neuromuscular re-education, postural training, soft tissue mobilization, strengthening, stretching and therapeutic activitiesOther planned  therapy interventions: Cupping Frequency: 1x week Duration in weeks: 12 Treatment plan discussed with: patient Plan details: Follow up on isometric strengthening, gait practice, etc     Subjective:  Subjective Evaluation    History of Present Illness   Subjective/Mechanism of injury: -Had injection to low back this past Friday -L ankle/Achilles still feels painful,feels like nerve pain, doesn't feel like pain is coming from back, NCV study was normal: education re: importance of strengthening tendon and more load to Achilles tendon  -In the process of moving as well, so very active, squatting still painful as well -Was working on heel raises from step, has had to hold on that due to pain, working on higher rep volume: discussed need for switching format of exercise, benefit of isometrics  Pain  Current pain rating: 2 At best pain rating: 2 At worst pain rating: 10 (Like after nerve study)    Objective: There were no vitals filed for this visit. Objective              Treatments Performed:  Interventions Performed    Anything left blank was deferred at this time During all sensitive area treatments, patient provided continual verbal consent before and during each intervention.  Remote Therapeutic Monitoring:   Therapeutic Ex [phone]): 44 minutes  Isometric heel raises from floor - using isometric hold for strengthening and analgesic effect - 20s, 20s, 20s   Bent knee slant board stretch - cues to keep knee over toes - 6x5s holds, 20s hold on last rep     Education re:  -Subjective review and education as above  -Review of self STM techniques and importance of this for  -  How to make lateral ankle exercise isometric as well  -Use of something like compression sock for both swelling and sensory feedback, using in small bursts  -Practice of normal gait mechanics  -Review of HEP additions as below and slant board info   Therapeutic Act (02469):    Manual (02859):     Neuro Re-Ed (02887):    Gait Training [phone]):    Aquatic Therapy [phone]):  Modalities:    Education:  The patient was educated regarding: therapy diagnosis, anatomy and physiology, treatment plan, rationale, therapeutic goals, expectations, and home exercise program. All patient questions were entertained and answered today.  The education provided was required by a skilled therapist through active lecturing and/or demonstration and the time spent was included in the appropriate/correlative billed codes.    HEP Provided:   Access Code: [email or web address] URL: [email or web address] Date: 12/22/2024 Prepared by: Duwaine Brought  Exercises - Heel Raises with Counter Support  - 1-3 x daily - 7 x weekly - 1 sets - 3-5 reps - 20-30s hold - Loaded Dorsiflexion Stretch on Slant Board  - 1-3 x daily - 7 x weekly - 1-2 sets - 5-10 reps - 5-10'' hold - 15-30s last rep  Access Code: [email or web address] URL: [email or web address] Date: 12/05/2024 Prepared by: Virginia  Shoesmith Exercises - Seated Ankle Eversion with Resistance  - 2 x daily - 7 x weekly - 2 sets - 10 reps    Skilled PT required for exercise performance to ensure proper technique for continued safe & effective progression.  Each CPT code is separate and distinct therefore medically necessary.      Goals  1. Pt will be independent with HEP, to promote self efficacy with management of L achilles pain in the home environment and to reduce the risk of further progression of pain 2. Pt will increase L ankle DF AROM by at least 5*, to facilitate less strain and pain to Achilles during gait  3. Pt will be able to complete at least 10 reps of single leg heel raise for LLE, pain level no higher than 1-2/10, to demonstrate improved functional strength for prolonged walking and stair negotiation 4. Pt will increase PSFS from initial eval score of 3.67 by at least 2, to demonstrate improved pt ability to perform prolonged walking and  standing  Treatment Time  Today's Evaluation/Treatment: 0741 - 0825 Total Time: 44 min Certification Period: 11/11/24 to 02/09/25  Date for PT Re-Evaluation: 01/10/25   Charges  Total Time Code Treatment Minutes: 44  PT Therapeutic Time Entry Therapeutic Exercise Time Entry: 44 (3 units)   Duwaine KATHEE Brought, DPT 12/22/2024 8:40 AM  [facility] ADMIT [address] [place] [address] Dept: [phone]  Dept Fax: [phone]     "

## 2024-12-22 NOTE — Patient Instructions (Signed)
 Iron Sucrose Injection What is this medication? IRON SUCROSE (EYE ern SOO krose) treats low levels of iron (iron deficiency anemia) in people with kidney disease. Iron is a mineral that plays an important role in making red blood cells, which carry oxygen from your lungs to the rest of your body. This medicine may be used for other purposes; ask your health care provider or pharmacist if you have questions. COMMON BRAND NAME(S): Venofer What should I tell my care team before I take this medication? They need to know if you have any of these conditions: Anemia not caused by low iron levels Heart disease High levels of iron in the blood Kidney disease Liver disease An unusual or allergic reaction to iron, other medications, foods, dyes, or preservatives Pregnant or trying to get pregnant Breastfeeding How should I use this medication? This medication is for infusion into a vein. It is given in a hospital or clinic setting. Talk to your care team about the use of this medication in children. While this medication may be prescribed for children as young as 2 years for selected conditions, precautions do apply. Overdosage: If you think you have taken too much of this medicine contact a poison control center or emergency room at once. NOTE: This medicine is only for you. Do not share this medicine with others. What if I miss a dose? Keep appointments for follow-up doses. It is important not to miss your dose. Call your care team if you are unable to keep an appointment. What may interact with this medication? Do not take this medication with any of the following: Deferoxamine Dimercaprol Other iron products This medication may also interact with the following: Chloramphenicol Deferasirox This list may not describe all possible interactions. Give your health care provider a list of all the medicines, herbs, non-prescription drugs, or dietary supplements you use. Also tell them if you smoke,  drink alcohol, or use illegal drugs. Some items may interact with your medicine. What should I watch for while using this medication? Visit your care team regularly. Tell your care team if your symptoms do not start to get better or if they get worse. You may need blood work done while you are taking this medication. You may need to follow a special diet. Talk to your care team. Foods that contain iron include: whole grains/cereals, dried fruits, beans, or peas, leafy green vegetables, and organ meats (liver, kidney). What side effects may I notice from receiving this medication? Side effects that you should report to your care team as soon as possible: Allergic reactions--skin rash, itching, hives, swelling of the face, lips, tongue, or throat Low blood pressure--dizziness, feeling faint or lightheaded, blurry vision Shortness of breath Side effects that usually do not require medical attention (report to your care team if they continue or are bothersome): Flushing Headache Joint pain Muscle pain Nausea Pain, redness, or irritation at injection site This list may not describe all possible side effects. Call your doctor for medical advice about side effects. You may report side effects to FDA at 1-800-FDA-1088. Where should I keep my medication? This medication is given in a hospital or clinic. It will not be stored at home. NOTE: This sheet is a summary. It may not cover all possible information. If you have questions about this medicine, talk to your doctor, pharmacist, or health care provider.  2024 Elsevier/Gold Standard (2023-05-18 00:00:00)

## 2024-12-25 ENCOUNTER — Ambulatory Visit: Payer: Self-pay

## 2024-12-25 NOTE — ED Provider Notes (Signed)
 " [facility]  ED Provider Note  Heather Krause 39 y.o. female DOB: [birth year] MRN: [id number]  Tele-Medical screening initiated and orders placed by DOROTHA JAYSON Simpers, PA. 12/25/2024 / 5:13 PM  39 y.o. female presents with complaints of itching red spots on her lower back that started last night when she got out of the shower and have persisted overnight.  States they are red and raised and now are on the back of the neck, inner hair and behind her ears.  States she had the same reaction when she took Celebrex and reports anaphylaxis to Pepcid .  Denies any new medications, personal hygiene products cleansers or other chemicals.  States she feels hot and feels like her voice is raspy and her throat is trying to close.  Patient had a second iron  infusion on Monday premedicated with Benadryl  and Solu-Medrol .  No reaction on Monday. BP 165/120, has a VP shunt, no complaints.  HR 119, afebrile, alert, breathing nonlabored, no facial swelling, speaking in full sentences.  Patient seen and received a tele-medical screening examination in triage.  The provider performing the medical screening exam was not located at the facility and was located remotely.  Patient understands that the provider is seeing them remotely and consents to the exam.  Appropriate orders have been initiated based on my brief physical exam and HPI. Patient placed in appropriate area until a treatment room becomes available for further evaluation and management by the in-house provider.  This tele-medical screening exam was electronically signed by DOROTHA JAYSON Simpers, PA on 12/25/2024 at 5:13 PM  History   Chief Complaint  Patient presents with   Allergic Reaction    Pt thinks she is having an allergic reaction to iron  infusion she received on Monday. She reports that she is premedicated with solu-medrol  before infusions and this is her second infusion. Reports rash on lower back and trunk. Reports feeling  like throat is closing. Airway WDL in triage and speaking in full sentences.   39 y.o. female presents with complaints of itching red spots on her lower back that started last night when she got out of the shower and have persisted overnight.  States they are red and raised and now are on the back of the neck, inner hair and behind her ears.  States she had the same reaction when she took Celebrex and reports anaphylaxis to Pepcid .  Denies any new medications, personal hygiene products cleansers or other chemicals.  States she feels hot and feels like her voice is raspy and her throat is trying to close.  Patient had a second iron  infusion on 3 days ago, premedicated with Benadryl  and Solu-Medrol , but no initial reaction. She does not that this is her second time receiving this round of iron  transfusions and she is receiving a double dose from what she had received last year.         Past Medical History:  Diagnosis Date   Anxiety    GERD (gastroesophageal reflux disease)    Hashimoto's disease    Heart palpitations    IBS (irritable bowel syndrome)    IIH (idiopathic intracranial hypertension)     Past Surgical History:  Procedure Laterality Date   Angio-renal denervation     Cerebral angiogram  2024   Cholecystectomy     Colonoscopy  2024   Ivf     Shoulder surgery Left    x 2   Thyroidectomy     Upper gastrointestinal endoscopy  Social History   Substance and Sexual Activity  Alcohol Use Yes   Alcohol/week: 0.0 - 1.0 standard drinks of alcohol   Comment: rare   Tobacco Use History[1] E-Cigarettes   Vaping Use Never User    Start Date     Cartridges/Day     Quit Date     Social History   Substance and Sexual Activity  Drug Use No         Allergies[2]  Discharge Medication List as of 12/25/2024  7:49 PM     CONTINUE these medications which have NOT CHANGED   Details  ALPRAZolam (XANAX) 0.25 mg tablet Take one tablet (0.25 mg dose) by  mouth 3 (three) times a day as needed for Anxiety., Starting Fri 07/01/2021, Historical Med    cetirizine (ZYRTEC) 10 mg tablet Take one tablet (10 mg dose) by mouth every evening., Historical Med    clindamycin (CLEOCIN T) 1% SWAB swab Apply one each topically 2 (two) times daily., Starting Sun 09/28/2024, Historical Med    cyanocobalamin  (VITAMIN B-12) 1000 mcg/mL injection Inject into the muscle., Starting Tue 12/09/2024, Until Sun 01/03/2026 at 2359, Historical Med    cyclobenzaprine (FLEXERIL) 10 mg tablet Take one tablet (10 mg dose) by mouth with meals as needed., Starting Tue 12/02/2024, Normal    dexAMETHasone Sodium Phosphate (DEXONTO 0.4%) 20 MG/5ML SOLN 1 Application by Iontophoresis route daily., Starting Mon 11/10/2024, Historical Med    dicyclomine (BENTYL) 10 mg capsule Take 1 capsule by mouth three times daily as needed, Starting Tue 10/14/2024, Normal    EPINEPHrine  (AUVI-Q ,EPIPEN ) 0.3 mg/0.3 mL injection Inject 0.3 mLs (0.3 mg dose) into the muscle once as needed for Anaphylaxis., Starting Mon 12/01/2024, Normal    hydrocortisone  (HYTONE ) 2.5 % lotion Apply one Application topically daily as needed., Historical Med    !! hydrOXYzine HCl (ATARAX) 10 mg tablet Take one tablet (10 mg dose) by mouth at bedtime as needed., Starting Wed 07/23/2024, Historical Med    !! hydrOXYzine HCl (ATARAX) 25 mg tablet Take one tablet (25 mg dose) by mouth. Alternate with 10 mg, Historical Med    ketorolac (TORADOL) 10 MG tablet Take one tablet (10 mg dose) by mouth every 6 (six) hours as needed., Starting Mon 02/11/2024, Normal    levothyroxine  sodium (LEVOXYL ) 175 mcg tablet Take  2 tablets (350mcg) daily, Normal    losartan potassium (COZAAR) 25 mg tablet Take one tablet (25 mg dose) by mouth daily., Starting Fri 08/29/2024, Normal    nitroGLYCERIN (NITROSTAT) 0.4 mg SL tablet Place one tablet (0.4 mg dose) under the tongue every 5 (five) minutes as needed for Chest pain., Starting Tue  05/06/2024, Normal    norethindrone  (ORTHO MICRONOR ,CAMILA ,NORA-BE,ERRIN ,JOLIVETTE,HEATHER ,SHAROBEL ) 0.35 MG tablet Take one tablet (0.35 mg dose) by mouth daily., Starting Tue 11/04/2024, Until Wed 11/04/2025, Historical Med    NURTEC 75 MG TBDP disintegrating tablet Take one tablet (75 mg dose) by mouth daily as needed., Historical Med    nystatin (MYCOSTATIN) ointment for 7 days, Historical Med    ondansetron  (ZOFRAN -ODT) 4 mg disintegrating tablet Take one tablet (4 mg dose) by mouth as needed., Starting Mon 05/08/2022, Historical Med    pantoprazole sodium (PROTONIX) 40 mg tablet TAKE 1 TABLET BY MOUTH ONCE DAILY AS NEEDED, Starting Tue 10/14/2024, Normal    predniSONE  (DELTASONE ) 20 mg tablet One tab with breakfast and one tab with lunch, Normal    pregabalin (LYRICA) 100 mg capsule Take one capsule (100 mg dose) by mouth daily., Historical Med  promethazine (PHENERGAN) 25 MG tablet Take one tablet (25 mg dose) by mouth every 8 (eight) hours as needed for Nausea., Starting Wed 02/01/2022, Normal    QULIPTA  60 MG TABS tablet Take one tablet (60 mg dose) by mouth daily., Historical Med    sertraline  (ZOLOFT ) 100 mg tablet Take two tablets (200 mg dose) by mouth at bedtime., Historical Med    triamcinolone  acetonide (KENALOG ) 0.1% cream Apply one g (1 Application dose) topically as needed., Starting Tue 08/19/2024, Historical Med     !! - Potential duplicate medications found. Please discuss with provider.      Primary Survey  Primary Survey  Review of Systems   Review of Systems  Constitutional:  Negative for activity change, appetite change, fatigue, fever and unexpected weight change.  HENT:  Negative for ear pain and sore throat.   Eyes:  Negative for pain, discharge and redness.  Respiratory:  Negative for cough, shortness of breath and wheezing.   Cardiovascular:  Negative for chest pain, palpitations and leg swelling.  Gastrointestinal:  Negative for abdominal pain,  diarrhea, nausea and vomiting.  Genitourinary:  Negative for decreased urine volume, difficulty urinating, dysuria, hematuria and urgency.  Musculoskeletal:  Negative for arthralgias, back pain, myalgias and neck pain.  Skin:  Negative for rash and wound.  Allergic/Immunologic: Negative for immunocompromised state.  Neurological:  Negative for syncope, light-headedness and headaches.  Hematological:  Negative for adenopathy. Does not bruise/bleed easily.  Psychiatric/Behavioral:  Negative for confusion. The patient is not nervous/anxious.   All other systems reviewed and are negative.   Physical Exam   ED Triage Vitals  BP 12/25/24 1637 (!) 165/120  Heart Rate 12/25/24 1637 119  Resp 12/25/24 1637 18  SpO2 12/25/24 1637 100 %  Temp 12/25/24 1639 98.3 F (36.8 C)    Physical Exam  Constitutional: She appears well-developed and well-nourished. She is in good hygiene. She has good hygiene. She does not appear distressed, does not appear ill and no respiratory distress. Not diaphoretic. HENT:  Head: Normocephalic and atraumatic.  Right Ear: Normal external ear.  Left Ear: Normal external ear.  Nose: Nose normal.  Mouth/Throat: Voice normal.  No posterior oropharyngeal edema. No swelling of the lips or tongue.  Eyes: EOM are intact. Pupils are equal, round, and reactive to light. Right eye: no drainage. no conjunctival injection. Left eye: no drainage. no conjunctival injection. No scleral icterus.  Neck: Normal range of motion and voice normal. Normal range of motion.  Cardiovascular: Normal rate.  Pulmonary/Chest: No respiratory distress. Good air movement. Not tachypneic. Respiratory effort normal. No wheezing. No rhonchi.  Musculoskeletal: Normal range of motion.     Cervical back: Normal range of motion. Normal range of motion.   Neurological: She is alert and oriented to person, place, and time. Gait normal. She has normal speech. No facial weakness.Gait normal. GCS Total Score  = 15.  , eye opening = 4 , verbal response = 5 , best motor response = 6 , pupil unreactive to light = 0  no facial droop.  Skin: Skin is not clammy. Skin is warm. Not diaphoretic. Skin is dry. Rash noted. Rash is urticarial. No pallor.  No cyanosis.  Urticarial rash with flushing of the entire face.  Psychiatric: She has a normal mood and affect. Her speech is normal. Her behavior is normal.     ED Course   Lab results: No data to display  Imaging: No data to display   ECG: ECG Results  None                                                                        Pre-Sedation Procedures  ED Course as of 12/25/24 2255  Heather Krause's Documentation  Thu Dec 25, 2024  8197 Patient has anaphylaxis from pepcid , will avoid this today  1941 Patient reports significant improvement in symptoms   Medical Decision Making Patient is a 38yoF who presents to the ED today with rash. Patient underwent iron  transfusion, at increased dosage, two days prior to the onset of the rash which has progressively worsened since then. This is most consistent with hives on physical examination. After IV solumedrol and benadryl , patient had significant improvement in rash and symptoms. Will discharge with medrol  and advised her to take zyrtec BID until symptoms completely resolve. Hematologist is through [facility] so advised she discuss symptoms with them as I suspect this is likely a transfusion reaction. Given strict return precautions regarding new and worsening symptoms. Patient is safe for discharge.  Patient has been reexamined and is ready to be discharged.  All diagnostic results have been reviewed and discussed with the patient.  After careful consideration review of the work-up completed today in the setting of patient's known medical problems, presenting problems, etc., I do feel that patient is safe for discharge at this time and is appropriate for outpatient  management as opposed to hospitalization.  Care plan has been outlined with patient understands all current diagnoses, results and treatment plans.  There are no new complaints, changes, or physical findings at this time.  All questions have been addressed.  All medications were reviewed with the patient.  The patient has been instructed to and agrees to follow-up with PCP, as well as return to the ED upon further deterioration.   Problems Addressed: Delayed transfusion reaction, initial encounter: acute illness or injury  Risk Prescription drug management. Parenteral controlled substances.          Provider Communication  Discharge Medication List as of 12/25/2024  7:49 PM     START taking these medications   Details  methylPREDNISolone  (MEDROL  DOSEPACK) 4 mg tablet follow package directions., Normal        Discharge Medication List as of 12/25/2024  7:49 PM      Discharge Medication List as of 12/25/2024  7:49 PM      Clinical Impression Final diagnoses:  Delayed transfusion reaction, initial encounter    ED Disposition     ED Disposition  Discharge   Condition  Stable   Comment  --                 Follow-up Information     Bernardino Ina, PA-C.   Specialties: Family Medicine, Physician Assistant Contact information: [address] [place] [id number] [phone]         [facility] Department.   Specialty: Emergency Medicine Comments: If symptoms worsen Contact information: [address] [facility] [place]   [id number] [phone] Additional information: When you arrive, our care team of nurses or providers will be ready to help you. Pre-registering, while not a scheduled appointment, helps us  prepare for your visit, ensuring a smoother and faster evaluation process.  Electronically signed by:       [1] Social History Tobacco Use  Smoking Status Never   Passive exposure:  Never  Smokeless Tobacco Never  [2] Allergies Allergen Reactions   Bee Anaphylaxis   Bee Venom Anaphylaxis   Celecoxib Anaphylaxis, Dermatitis, Hypertension, Itching, Rash, Redness and Shortness Of Breath    Patient states she developed rash from her neck up and started to feel some throat symptoms. Received steroids and this resolved.   Flu Virus Vaccine Other    Autoimmune response   Gadolinium-Containing Contrast Media Dizziness, Itching, Nausea Only and Shortness Of Breath   Hibiscus Extract Swelling and Palpitations   Influenza Vac Split Quad Other    Autoimmune response   Maxalt-Mlt Shortness Of Breath and Swelling   Pepcid  Anaphylaxis   Acetazolamide Dizziness and Nausea Only   Amitriptyline Swelling   Bisoprolol-Hydrochlorothiazide Swelling   Contact Dermatitis Rash    Plastic IV tape rips skin off.   Diclofenac Sodium Other   Famotidine  Other   Gadolinium Derivatives Nausea And Vomiting and Other    Migraine, N, V within hour of MRI scan; needs premedication for future scans   Iodinated Diagnostic Agents Other    Worsening headache  Only MRI contrast   Latex Swelling   Other Other, Rash, Hives and Swelling    Autoimmune response  Plastic IV tape rips skin off., Plastic IV tape rips skin off.   Silicone Dermatitis and Other    Plastic IV tape rips skin off.   Topamax Dizziness and Other   Heather FORBES Blush, PA-C 12/25/24 2255  "

## 2024-12-26 ENCOUNTER — Other Ambulatory Visit: Payer: Self-pay | Admitting: Family

## 2024-12-26 ENCOUNTER — Encounter: Payer: Self-pay | Admitting: *Deleted

## 2024-12-29 ENCOUNTER — Inpatient Hospital Stay

## 2025-01-20 ENCOUNTER — Inpatient Hospital Stay: Admitting: Medical Oncology

## 2025-01-20 ENCOUNTER — Inpatient Hospital Stay

## 2025-01-20 ENCOUNTER — Other Ambulatory Visit: Payer: Self-pay | Admitting: Medical Oncology

## 2025-01-20 DIAGNOSIS — D649 Anemia, unspecified: Secondary | ICD-10-CM

## 2025-01-20 DIAGNOSIS — Z8349 Family history of other endocrine, nutritional and metabolic diseases: Secondary | ICD-10-CM

## 2025-01-20 DIAGNOSIS — D509 Iron deficiency anemia, unspecified: Secondary | ICD-10-CM

## 2025-01-21 ENCOUNTER — Inpatient Hospital Stay: Admitting: Medical Oncology

## 2025-01-21 ENCOUNTER — Inpatient Hospital Stay: Attending: Hematology & Oncology

## 2025-01-21 ENCOUNTER — Encounter: Payer: Self-pay | Admitting: Medical Oncology

## 2025-01-21 VITALS — BP 135/93 | HR 103 | Temp 97.8°F | Resp 19 | Ht 65.0 in | Wt 253.0 lb

## 2025-01-21 DIAGNOSIS — D751 Secondary polycythemia: Secondary | ICD-10-CM

## 2025-01-21 DIAGNOSIS — Z8349 Family history of other endocrine, nutritional and metabolic diseases: Secondary | ICD-10-CM

## 2025-01-21 DIAGNOSIS — D509 Iron deficiency anemia, unspecified: Secondary | ICD-10-CM

## 2025-01-21 DIAGNOSIS — D649 Anemia, unspecified: Secondary | ICD-10-CM

## 2025-01-21 LAB — CBC
HCT: 42 % (ref 36.0–46.0)
Hemoglobin: 14.6 g/dL (ref 12.0–15.0)
MCH: 31.9 pg (ref 26.0–34.0)
MCHC: 34.8 g/dL (ref 30.0–36.0)
MCV: 91.7 fL (ref 80.0–100.0)
Platelets: 280 10*3/uL (ref 150–400)
RBC: 4.58 MIL/uL (ref 3.87–5.11)
RDW: 12.9 % (ref 11.5–15.5)
WBC: 5.8 10*3/uL (ref 4.0–10.5)
nRBC: 0 % (ref 0.0–0.2)

## 2025-01-21 LAB — IRON AND IRON BINDING CAPACITY (CC-WL,HP ONLY)
Iron: 72 ug/dL (ref 28–170)
Saturation Ratios: 20 % (ref 10.4–31.8)
TIBC: 354 ug/dL (ref 250–450)
UIBC: 283 ug/dL

## 2025-01-21 LAB — FERRITIN: Ferritin: 354 ng/mL — ABNORMAL HIGH (ref 11–307)

## 2025-01-21 LAB — VITAMIN B12: Vitamin B-12: 1973 pg/mL — ABNORMAL HIGH (ref 180–914)

## 2025-01-21 NOTE — Progress Notes (Signed)
 " Hematology and Oncology Follow Up Visit  Heather Krause [id number] 02-26-1986 39 y.o. 01/21/2025  Past Medical History:  Diagnosis Date   Allergy    Latex   Anxiety    Asthma    Depression    GERD (gastroesophageal reflux disease)    Hypertension    Thyroid  disease     Principle Diagnosis:  Anemia  Current Therapy:   IV Iron - Venofer - last dose 07/19/2023 Oral B12 1,000 mcg once daily sublingual     Interim History:  Heather Krause is back for a scheduled follow-up for her anemia.   She states that she has been doing fair. Stress level is higher.   After her last infusion at our office she experienced a delayed rash. She was seen in the ER and by dermatology who had mixed ideas of where the rash originated. 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   She has recently changed her OCP to a lower estrogen level and recently started Qulipta  for migraines.   She continued to work with her neuro team at [facility]. 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 This went well. She reports improvements in headaches since but still has continued dizziness at times. She also is on Qulipta  for headache prevention. She had a normal heart cath recently as well. She is working with her cardiologist about her dizziness. She is waiting to see if she can have jugular stent surgery.   She also is followed by Endocrinology for her hypothyroidism. Recently her Levothyroxine  was decreased due to some lower TSH values.   She also continues to work with her GI team- [facility].   She does snore and has a history of mild sleep apnea which was diagnosed when was a similar weight- around 2015ish. She does not use a CPAP machine. Sleep study is scheduled for March.   In terms of her IDA she has had a few nosebleeds and a few bruises.  Menstrual cycles are regular in nature as well.   No SOB, hematuria, hemoptysis,  epistaxis, melena.      Wt Readings from Last 3 Encounters:  01/21/25 253 lb (114.8 kg)  12/22/24 264 lb (119.7 kg)  12/09/24 254 lb 6.4 oz (115.4 kg)     Medications:   Current Outpatient Medications:    ALPRAZolam (XANAX) 0.25 MG tablet, Take 0.25 mg by mouth 2 (two) times daily as needed., Disp: , Rfl:    Atogepant  (QULIPTA ) 60 MG TABS, Take 1 tablet (60 mg total) by mouth daily., Disp: 30 tablet, Rfl: 6   cetirizine (ZYRTEC) 10 MG tablet, Take 10 mg by mouth daily., Disp: , Rfl:    cyanocobalamin  (VITAMIN B12) 1000 MCG/ML injection, Inject 1 mL (1,000 mcg total) into the muscle once a week for 30 days, THEN 1 mL (1,000 mcg total) every 30 (thirty) days., Disp: 16 mL, Rfl: 0   cyclobenzaprine (FLEXERIL) 10 MG tablet, Take 10 mg by mouth 3 (three) times daily as needed for muscle spasms., Disp: , Rfl:    dicyclomine (BENTYL) 10 MG capsule, Take 10 mg by mouth 4 (four) times daily -  before meals and at bedtime., Disp: , Rfl:    EPINEPHRINE  0.3 mg/0.3 mL IJ SOAJ injection, INJECT ONE PEN INTO THE THIGH OR AS DIRECTED. AFTER ADMINISTRATION CALL 911. IF ANAPHYLACTIC SYMPTOMS PERSIST AFTER FIRST DOSE, MAY REPEAT DOSE IN 5 TO 15 MINUTES. (Patient not  taking: Reported on 12/04/2024), Disp: 2 mL, Rfl: 0   hydrocortisone  2.5 % lotion, Apply topically 2 (two) times daily. Apply to the face and neck, Disp: 59 mL, Rfl: 0   hydrOXYzine (ATARAX) 10 MG tablet, Take 10 mg by mouth at bedtime as needed., Disp: , Rfl:    ketorolac (TORADOL) 10 MG tablet, Take 10 mg by mouth every 6 (six) hours as needed for moderate pain (pain score 4-6)., Disp: , Rfl:    levothyroxine  (SYNTHROID ) 175 MCG tablet, Take 350 mcg by mouth daily., Disp: , Rfl:    losartan (COZAAR) 25 MG tablet, Take 25 mg by mouth daily., Disp: , Rfl:    MULTIPLE VITAMIN PO, Take by mouth., Disp: , Rfl:    Needles & Syringes MISC, 1 Product by Does not apply route once a week., Disp: 50 each, Rfl: 0   nitroGLYCERIN (NITROSTAT) 0.4 MG SL  tablet, Place 0.4 mg under the tongue every 5 (five) minutes as needed., Disp: , Rfl:    ondansetron  (ZOFRAN -ODT) 4 MG disintegrating tablet, Take 1 tablet (4 mg total) by mouth every 8 (eight) hours as needed for nausea or vomiting., Disp: 20 tablet, Rfl: 0   pantoprazole (PROTONIX) 40 MG tablet, Take 1 tablet by mouth daily., Disp: , Rfl:    pregabalin (LYRICA) 100 MG capsule, Take 100 mg by mouth 2 (two) times daily., Disp: , Rfl:    Rimegepant Sulfate (NURTEC) 75 MG TBDP, Take 1 tablet (75 mg total) by mouth daily as needed., Disp: 8 tablet, Rfl: 6   sertraline  (ZOLOFT ) 100 MG tablet, Take 1 tablet by mouth once daily, Disp: 60 tablet, Rfl: 0   triamcinolone  cream (KENALOG ) 0.1 %, Apply 1 Application topically 2 (two) times daily. Do not apply to the face or neck, Disp: 80 g, Rfl: 2  Allergies:  Allergies  Allergen Reactions   Amitriptyline Hypertension, Swelling and Other (See Comments)    Generalized body swelling, but not throat, face or lips. Could not wake up.  Unknown    Generalized body swelling, but not throat, face or lips. Could not wake up.   Bee Venom Anaphylaxis   Famotidine  Anaphylaxis and Nausea Only   Fluarix Quadrivalent [Influenza Vac Split Quad] Other (See Comments)    Autoimmune response   Honey Bee Venom Anaphylaxis   Influenza Virus Vaccine Other (See Comments)    Autoimmune response   Rizatriptan Shortness Of Breath and Swelling   Venofer  [Iron  Sucrose] Shortness Of Breath    Patient had rash with raised red hive as well as SOB and swelling around mouth.    Acetazolamide Other (See Comments), Nausea Only and Swelling   Bisoprolol-Hydrochlorothiazide Swelling    Generalized swelling but not the face or throat   Diclofenac Sodium Swelling    Generalized swelling but not the face or throat.   Diclofenac Sodium Nausea Only, Other (See Comments) and Swelling    Generalized swelling but not the face or throat.   Gadolinium Derivatives Nausea And Vomiting and  Other (See Comments)    Migraine, N, V within hour of MRI scan; needs premedication for future scans   Silicone Dermatitis and Other (See Comments)    Plastic IV tape rips skin off.    Celebrex [Celecoxib] Rash    Patient states she developed rash from her neck up and started to feel some throat symptoms. Received steroids and this resolved.   Hibiscus Extract Palpitations and Swelling   Latex Rash    Past Medical History, Surgical history,  Social history, and Family History were reviewed and updated.  Review of Systems: Review of Systems  Constitutional:  Positive for fatigue.  HENT:   Negative for nosebleeds.   Respiratory:  Negative for shortness of breath.   Gastrointestinal:  Negative for blood in stool.  Genitourinary:  Negative for hematuria.   Neurological:  Negative for dizziness.     Physical Exam:  height is 5' 5 (1.651 m) and weight is 253 lb (114.8 kg). Her oral temperature is 97.8 F (36.6 C). Her blood pressure is 135/93 (abnormal) and her pulse is 103 (abnormal). Her respiration is 19 and oxygen saturation is 99%.   Physical Exam Vitals and nursing note reviewed.  Constitutional:      General: She is not in acute distress.    Appearance: Normal appearance. She is obese. She is not ill-appearing, toxic-appearing or diaphoretic.  Cardiovascular:     Rate and Rhythm: Normal rate and regular rhythm.     Heart sounds: Normal heart sounds.  Pulmonary:     Effort: Pulmonary effort is normal.     Breath sounds: Normal breath sounds.  Skin:    General: Skin is warm.     Coloration: Skin is not pale.  Neurological:     General: No focal deficit present.     Mental Status: She is alert and oriented to person, place, and time.    Lab Results  Component Value Date   WBC 5.8 01/21/2025   HGB 14.6 01/21/2025   HCT 42.0 01/21/2025   MCV 91.7 01/21/2025   PLT 280 01/21/2025     Chemistry      Component Value Date/Time   NA 140 10/08/2023 0848   K 4.3  10/08/2023 0848   CL 105 10/08/2023 0848   CO2 27 10/08/2023 0848   BUN 11 10/08/2023 0848   CREATININE 0.89 10/08/2023 0848   CREATININE 0.82 07/04/2023 1428      Component Value Date/Time   CALCIUM 9.0 10/08/2023 0848   ALKPHOS 53 10/08/2023 0848   AST 18 10/08/2023 0848   ALT 33 10/08/2023 0848   BILITOT 0.2 (L) 10/08/2023 0848     Encounter Diagnoses  Name Primary?   Iron  deficiency anemia, unspecified iron  deficiency anemia type Yes   Family history of B12 deficiency    Erythrocytosis     Impression and Plan:  CHRISTINEA BRIZUELA is a 39 y.o. female with IDA of unknown origin. She has had work up with GI, cardiology, etc. Recently levels have been stable.   Hgb today is 14.6 She has a sleep study in March for a sleep consult  Iron  studied pending. Given previous possible reaction will hold off on additional iron  until she gets further allergy testing.   Disposition: RTC 3 months APP, labs (CBC, iron , ferritin, B12) -[place]   Lauraine Dais PA-C 1/28/202610:40 AM   "

## 2025-01-27 ENCOUNTER — Ambulatory Visit: Payer: Self-pay | Admitting: Medical Oncology

## 2025-02-11 ENCOUNTER — Ambulatory Visit: Admitting: Neurology

## 2025-03-05 ENCOUNTER — Inpatient Hospital Stay: Admitting: Medical Oncology

## 2025-03-05 ENCOUNTER — Inpatient Hospital Stay

## 2025-04-21 ENCOUNTER — Inpatient Hospital Stay: Admitting: Medical Oncology

## 2025-04-21 ENCOUNTER — Inpatient Hospital Stay

## 2025-05-26 ENCOUNTER — Telehealth: Admitting: Diagnostic Neuroimaging
